# Patient Record
Sex: Male | Born: 1960 | Race: White | Hispanic: No | Marital: Married | Smoking: Former smoker
Health system: Southern US, Community
[De-identification: ages and names within clinical notes are randomized; demographics above are authoritative.]

## PROBLEM LIST (undated history)

## (undated) DIAGNOSIS — R972 Elevated prostate specific antigen [PSA]: Secondary | ICD-10-CM

## (undated) DIAGNOSIS — N400 Enlarged prostate without lower urinary tract symptoms: Secondary | ICD-10-CM

## (undated) HISTORY — DX: Benign prostatic hyperplasia without lower urinary tract symptoms: N40.0

## (undated) HISTORY — DX: Elevated prostate specific antigen (PSA): R97.20

## (undated) HISTORY — PX: OTHER SURGICAL HISTORY: SHX169

---

## 2006-04-21 ENCOUNTER — Encounter: Admission: RE | Admit: 2006-04-21 | Discharge: 2006-04-21 | Payer: Self-pay | Admitting: Orthopaedic Surgery

## 2008-03-31 ENCOUNTER — Ambulatory Visit (HOSPITAL_COMMUNITY): Admission: RE | Admit: 2008-03-31 | Discharge: 2008-04-01 | Payer: Self-pay | Admitting: Orthopaedic Surgery

## 2010-12-28 NOTE — Op Note (Signed)
Daniel Estrada, Daniel Estrada                ACCOUNT NO.:  [id number]   MEDICAL RECORD NO.:  [id number]          PATIENT TYPE:  OIB   LOCATION:  5013                         FACILITY:  [facility]   PHYSICIAN:  Mark C. Ophelia Charter, M.D.    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   PROCEDURE:  Right L4 laminotomy.  Microdiskectomy, removal of large  extruded fragment.   SURGEON:  Mark C. Ophelia Charter, MD   ANESTHESIA:  GOT.   ASSISTANT:  Wende Neighbors, PA-C   ESTIMATED BLOOD LOSS:  Minimal.   PROCEDURE:  After induction of general anesthesia and orotracheal  intubation, the patient placed in the [place] frame.  Standard prepping  and draping with DuraPrep, towels were used to square the area, Betadine  Vi-drape after sterile skin marker was used on the old incision, which  was at L5-S1 on the left.  Needle localization above this cross-table  lateral x-ray after laminectomy sheet confirmed that the needle was just  below the L4-L5 disk space.  Incision was made appropriately,  subperiosteal dissection, Taylor retractor placed laterally, ligament  was removed.  A Penfield #4 was placed just above the L4-L5 space.  Cross-table lateral x-ray confirmed this was appropriate position.  Operative microscope was draped and brought in, the ligament was taken  down, dura was exposed, and nerve root was gently retracted towards the  midline.  There was disk bulge with extruded subligamentous fragment and  portion of the fragment could be visualized, as it did pull the ligament  off the vertebral body at L5.  The annulus was incised slightly with  ligament with 15-blade and medially large extruded fragment was  grasped  with at the length of pituitary and gently teased out.  All came out one  large piece, passes were made through the disk space, and minimal amount  of remaining material.  Once the fragment had been removed, a hockey-  stick was used to palpate the pocket with the ligament that had been  pulled and L5 vertebral body was exposed with no remaining extruded  disk.  Nerve root was free, foramina was enlarged, palpation on the  nerve root was performed, and dura  was intact.  After irrigation of disk space, 0 Vicryl was placed on the  fascia, 2-0 on the subcutaneous tissue, 4-0 Vicryl subcuticular closure.  Tincture of benzoin and Steri-Strips, 4 x 4's tape, and then transferred  to supine position, extubated, and transferred to recovery room.  The  patient tolerated the procedure well.      Mark C. Ophelia Charter, M.D.  Electronically Signed     MCY/MEDQ  D:  03/31/2008  T:  04/01/2008  Job:  [id number]

## 2011-05-13 LAB — URINALYSIS, ROUTINE W REFLEX MICROSCOPIC
Glucose, UA: NEGATIVE
Hgb urine dipstick: NEGATIVE
pH: 5

## 2011-05-13 LAB — CBC
Hemoglobin: 17.2 — ABNORMAL HIGH
MCHC: 34.4
Platelets: 251
RDW: 12.8

## 2011-05-13 LAB — COMPREHENSIVE METABOLIC PANEL
AST: 25
Albumin: 4.7
BUN: 12
Chloride: 104
Creatinine, Ser: 1.08
Potassium: 4.5
Total Bilirubin: 1.1
Total Protein: 7

## 2015-10-02 ENCOUNTER — Encounter: Payer: Self-pay | Admitting: Family Medicine

## 2015-10-06 ENCOUNTER — Encounter: Payer: Self-pay | Admitting: Urology

## 2015-10-06 ENCOUNTER — Ambulatory Visit (INDEPENDENT_AMBULATORY_CARE_PROVIDER_SITE_OTHER): Payer: BLUE CROSS/BLUE SHIELD | Admitting: Urology

## 2015-10-06 VITALS — BP 120/74 | HR 60 | Ht 69.0 in | Wt 206.6 lb

## 2015-10-06 DIAGNOSIS — N401 Enlarged prostate with lower urinary tract symptoms: Secondary | ICD-10-CM | POA: Diagnosis not present

## 2015-10-06 DIAGNOSIS — R972 Elevated prostate specific antigen [PSA]: Secondary | ICD-10-CM | POA: Diagnosis not present

## 2015-10-06 DIAGNOSIS — N138 Other obstructive and reflux uropathy: Secondary | ICD-10-CM

## 2015-10-06 MED ORDER — FINASTERIDE 5 MG PO TABS: 5.0000 mg | ORAL_TABLET | Freq: Every day | ORAL | Status: DC

## 2015-10-06 NOTE — Progress Notes (Signed)
10/06/2015 4:09 PM   Daniel Estrada April 21, 1961 [id number]  Referring provider: No referring provider defined for this encounter.  Chief Complaint  Patient presents with  . Elevated PSA    referred by [facility]    HPI: Patient is a 55 year old Caucasian male who is referred by his primary care physician, Dr. Juanetta Gosling, for an elevated PSA.  He states he had not seen a doctor in several years. The elevated PSA was found on a routine well exam. Patient states he has frequent urination, nocturia and a weak urinary stream. His I PSS score is 14/2. He is not aware of any family history of prostate cancer.  He does not have any previous PSA's.  PSA is found to be 4.51 ng/mL on 09/28/2015.      IPSS      10/06/15 1500       International Prostate Symptom Score   How often have you had the sensation of not emptying your bladder? About half the time     How often have you had to urinate less than every two hours? About half the time     How often have you found you stopped and started again several times when you urinated? Less than 1 in 5 times     How often have you found it difficult to postpone urination? Less than 1 in 5 times     How often have you had a weak urinary stream? About half the time     How often have you had to strain to start urination? Less than 1 in 5 times     How many times did you typically get up at night to urinate? 2 Times     Total IPSS Score 14     Quality of Life due to urinary symptoms   If you were to spend the rest of your life with your urinary condition just the way it is now how would you feel about that? Mostly Satisfied        Score:  1-7 Mild 8-19 Moderate 20-35 Severe  He has mild to no erectile dysfunction. His SHIM score is 22. He denies any pain with erections or curvature with erections.      SHIM      10/06/15 1550       SHIM: Over the last 6 months:   How do you rate your confidence that you could get and keep an  erection? Moderate     When you had erections with sexual stimulation, how often were your erections hard enough for penetration (entering your partner)? Most Times (much more than half the time)     During sexual intercourse, how often were you able to maintain your erection after you had penetrated (entered) your partner? Not Difficult     During sexual intercourse, how difficult was it to maintain your erection to completion of intercourse? Not Difficult     When you attempted sexual intercourse, how often was it satisfactory for you? Not Difficult     SHIM Total Score   SHIM 22        Score: 1-7 Severe ED 8-11 Moderate ED 12-16 Mild-Moderate ED 17-21 Mild ED 22-25 No ED  He has not had any recent dysuria, gross hematuria or suprapubic pain. He has not had any recent fevers, chills, nausea or vomiting.  He cannot recall if he had sexual activity around the time his blood was drawn. He does not suffer with constipation  or hard stools.  PMH: Past Medical History  Diagnosis Date  . Elevated PSA     Surgical History: Past Surgical History  Procedure Laterality Date  . Back surgeries  2000/2010    ruptured discs    Home Medications:    Medication List       This list is accurate as of: 10/06/15  4:09 PM.  Always use your most recent med list.               finasteride 5 MG tablet  Commonly known as:  PROSCAR  Take 1 tablet (5 mg total) by mouth daily.        Allergies: No Known Allergies  Family History: Family History  Problem Relation Age of Onset  . Kidney disease Neg Hx   . Prostate cancer Neg Hx     Social History:  reports that he has quit smoking. His smoking use included Cigarettes. He smoked 0.50 packs per day. He does not have any smokeless tobacco history on file. He reports that he drinks alcohol. He reports that he does not use illicit drugs.  ROS: UROLOGY Frequent Urination?: Yes Hard to postpone urination?: No Burning/pain with urination?:  No Get up at night to urinate?: Yes Leakage of urine?: No Urine stream starts and stops?: No Trouble starting stream?: No Do you have to strain to urinate?: No Blood in urine?: No Urinary tract infection?: No Sexually transmitted disease?: No Injury to kidneys or bladder?: No Painful intercourse?: No Weak stream?: Yes Erection problems?: No Penile pain?: No  Gastrointestinal Nausea?: No Vomiting?: No Indigestion/heartburn?: No Diarrhea?: No Constipation?: No  Constitutional Fever: No Night sweats?: No Weight loss?: No Fatigue?: No  Skin Skin rash/lesions?: No Itching?: No  Eyes Blurred vision?: No Double vision?: No  Ears/Nose/Throat Sore throat?: No Sinus problems?: No  Hematologic/Lymphatic Swollen glands?: No Easy bruising?: No  Cardiovascular Leg swelling?: No Chest pain?: No  Respiratory Cough?: No Shortness of breath?: No  Endocrine Excessive thirst?: No  Musculoskeletal Back pain?: No Joint pain?: No  Neurological Headaches?: No Dizziness?: No  Psychologic Depression?: No Anxiety?: No  Physical Exam: BP 120/74 mmHg  Pulse 60  Ht 5\' 9"  (1.753 m)  Wt 206 lb 9.6 oz (93.713 kg)  BMI 30.50 kg/m2  Constitutional: Well nourished. Alert and oriented, No acute distress. HEENT: [place] AT, moist mucus membranes. Trachea midline, no masses. Cardiovascular: No clubbing, cyanosis, or edema. Respiratory: Normal respiratory effort, no increased work of breathing. GI: Abdomen is soft, non tender, non distended, no abdominal masses. Liver and spleen not palpable.  No hernias appreciated.  Stool sample for occult testing is not indicated.   GU: No CVA tenderness.  No bladder fullness or masses.  Patient with circumcised phallus.  Urethral meatus is patent.  No penile discharge. No penile lesions or rashes. Scrotum without lesions, cysts, rashes and/or edema.  Testicles are located scrotally bilaterally. No masses are appreciated in the testicles. Left  and right epididymis are normal. Rectal: Patient with  normal sphincter tone. Anus and perineum without scarring or rashes. No rectal masses are appreciated. Prostate is approximately 55 grams, no nodules are appreciated. Seminal vesicles are normal. Skin: No rashes, bruises or suspicious lesions. Lymph: No cervical or inguinal adenopathy. Neurologic: Grossly intact, no focal deficits, moving all 4 extremities. Psychiatric: Normal mood and affect.  Laboratory Data: Lab Results  Component Value Date   WBC 8.6 03/28/2008   HGB 17.2* 03/28/2008   HCT 49.9 03/28/2008   MCV 92.1 03/28/2008  PLT 251 03/28/2008   Lab Results  Component Value Date   CREATININE 1.08 03/28/2008   Lab Results  Component Value Date   AST 25 03/28/2008   Lab Results  Component Value Date   ALT 41 03/28/2008    Assessment & Plan:    1. Elevated PSA:   Patient was found to have an elevated PSA of 4.51.  I do not have previous PSA's to establish a trend.  I repeated the PSA today to make sure this is a true value and not a laboratory error.  I stated that the PSA did return elevated, we should pursue a biopsy at that time.  If it returned within normal parameters, I have started him on finasteride 5 mg daily and he'll be returning in 3 months. We will repeat a PSA at that time to establish a trend.  - PSA  2. BPH with LUTS:   Patient's IPSS score is 14/2.   I discussed the treatment options for BPH, such as: observation over time with prn avoidance of alcohol/caffeine; medical treatment or TURP.  Patient like to pursue medical treatment.  I will start finasteride 5 mg daily.    The side effects of finasteride are also discussed with the patient, such as: impotence, loss of interest in sex, or trouble having an orgasm; abnormal ejaculation; swelling in his hands and/or feet; swelling and/or tenderness in his breasts.  He will follow up in 3 months for a PSA, exam and an IPSS score.     Return in about 3 months  (around 01/03/2016) for IPSS score and PSA.  These notes generated with voice recognition software. I apologize for typographical errors.  Michiel Cowboy, PA-C  [facility] [address], [address] [place], [place] [address] [phone]

## 2015-10-07 ENCOUNTER — Telehealth: Payer: Self-pay

## 2015-10-07 DIAGNOSIS — N401 Enlarged prostate with lower urinary tract symptoms: Secondary | ICD-10-CM

## 2015-10-07 DIAGNOSIS — R972 Elevated prostate specific antigen [PSA]: Secondary | ICD-10-CM | POA: Insufficient documentation

## 2015-10-07 DIAGNOSIS — N138 Other obstructive and reflux uropathy: Secondary | ICD-10-CM | POA: Insufficient documentation

## 2015-10-07 LAB — PSA: PROSTATE SPECIFIC AG, SERUM: 3.5 ng/mL (ref 0.0–4.0)

## 2015-10-07 NOTE — Telephone Encounter (Signed)
Spoke with pt in reference to PSA results. Pt voiced understanding. Pt has an appt in May.

## 2015-10-07 NOTE — Telephone Encounter (Signed)
-----   Message from Harle Battiest, PA-C sent at 10/07/2015 12:12 PM EST ----- Patient's PSA has declined from 4.51 to 3.5.  I would like to see him again in 3 months.

## 2015-12-16 ENCOUNTER — Other Ambulatory Visit: Payer: Self-pay

## 2015-12-16 DIAGNOSIS — N400 Enlarged prostate without lower urinary tract symptoms: Secondary | ICD-10-CM

## 2015-12-16 MED ORDER — FINASTERIDE 5 MG PO TABS: 5.0000 mg | ORAL_TABLET | Freq: Every day | ORAL | Status: DC

## 2016-01-04 ENCOUNTER — Ambulatory Visit (INDEPENDENT_AMBULATORY_CARE_PROVIDER_SITE_OTHER): Payer: BLUE CROSS/BLUE SHIELD | Admitting: Urology

## 2016-01-04 ENCOUNTER — Encounter: Payer: Self-pay | Admitting: Urology

## 2016-01-04 VITALS — BP 118/73 | HR 60 | Ht 69.0 in | Wt 206.5 lb

## 2016-01-04 DIAGNOSIS — N401 Enlarged prostate with lower urinary tract symptoms: Secondary | ICD-10-CM

## 2016-01-04 DIAGNOSIS — R972 Elevated prostate specific antigen [PSA]: Secondary | ICD-10-CM | POA: Diagnosis not present

## 2016-01-04 DIAGNOSIS — N138 Other obstructive and reflux uropathy: Secondary | ICD-10-CM

## 2016-01-04 NOTE — Progress Notes (Signed)
4:23 PM   Daniel Estrada 10-11-1960 [id number]  Referring provider: No referring provider defined for this encounter.  Chief Complaint  Patient presents with  . Benign Prostatic Hypertrophy    3 month follow up  . Elevated PSA    HPI: Patient is a 55 year old Caucasian male who was found to have an elevated PSA by Dr. Juanetta Gosling with BPH and LUTS and was started on finasteride 5 mg daily who presents today for 3 month follow-up.  Elevated PSA Patient was found to have an elevated PSA of 4.51 ng/mL on 09/21/2015 during a well visit exam with Dr. Juanetta Gosling.  A repeated PSA at his initial visit with [place] was 3.5 on 10/06/2015.  He is not aware of any family history of prostate cancer.   He was initiated on finasteride 5 mg daily and asked to follow up in 3 months.  He presents today for a follow up PSA and exam.     BPH WITH LUTS His IPSS score today is 10, which is moderate lower urinary tract symptomatology. He is mostly satisfied with his quality life due to his urinary symptoms.  His previous IPSS score was 14/2.  His major complaint today frequency, urgency and nocturia 3.  He has had these symptoms for many years.  He denies any dysuria, hematuria or suprapubic pain.  He currently taking finasteride 5 mg daily.  He also denies any recent fevers, chills, nausea or vomiting. He does not have a family history of PCa.      IPSS      01/04/16 1500       International Prostate Symptom Score   How often have you had the sensation of not emptying your bladder? Less than 1 in 5     How often have you had to urinate less than every two hours? Less than half the time     How often have you found you stopped and started again several times when you urinated? Less than 1 in 5 times     How often have you found it difficult to postpone urination? Less than half the time     How often have you had a weak urinary stream? Less than 1 in 5 times     How often have you had to strain to start  urination? Not at All     How many times did you typically get up at night to urinate? 3 Times     Total IPSS Score 10     Quality of Life due to urinary symptoms   If you were to spend the rest of your life with your urinary condition just the way it is now how would you feel about that? Mostly Satisfied        Score:  1-7 Mild 8-19 Moderate 20-35 Severe  PMH: Past Medical History  Diagnosis Date  . Elevated PSA   . BPH (benign prostatic hyperplasia)     Surgical History: Past Surgical History  Procedure Laterality Date  . Back surgeries  2000/2010    ruptured discs    Home Medications:    Medication List       This list is accurate as of: 01/04/16  4:23 PM.  Always use your most recent med list.               finasteride 5 MG tablet  Commonly known as:  PROSCAR  Take 1 tablet (5 mg total) by mouth daily.  polyethylene glycol powder powder  Commonly known as:  GLYCOLAX/MIRALAX  See admin instructions. Reported on 01/04/2016        Allergies: No Known Allergies  Family History: Family History  Problem Relation Age of Onset  . Kidney disease Neg Hx   . Prostate cancer Neg Hx     Social History:  reports that he has quit smoking. His smoking use included Cigarettes. He smoked 0.50 packs per day. He does not have any smokeless tobacco history on file. He reports that he drinks alcohol. He reports that he does not use illicit drugs.  ROS: UROLOGY Frequent Urination?: Yes Hard to postpone urination?: Yes Burning/pain with urination?: No Get up at night to urinate?: Yes Leakage of urine?: No Urine stream starts and stops?: No Trouble starting stream?: No Do you have to strain to urinate?: No Blood in urine?: No Urinary tract infection?: No Sexually transmitted disease?: No Injury to kidneys or bladder?: No Painful intercourse?: No Weak stream?: No Erection problems?: No Penile pain?: No  Gastrointestinal Nausea?: No Vomiting?:  No Indigestion/heartburn?: No Diarrhea?: No Constipation?: No  Constitutional Fever: No Night sweats?: No Weight loss?: No Fatigue?: No  Skin Skin rash/lesions?: No Itching?: No  Eyes Blurred vision?: No Double vision?: No  Ears/Nose/Throat Sore throat?: No Sinus problems?: No  Hematologic/Lymphatic Swollen glands?: No Easy bruising?: No  Cardiovascular Leg swelling?: No Chest pain?: No  Respiratory Cough?: No Shortness of breath?: No  Endocrine Excessive thirst?: No  Musculoskeletal Back pain?: No Joint pain?: No  Neurological Headaches?: No Dizziness?: No  Psychologic Depression?: No Anxiety?: No  Physical Exam: BP 118/73 mmHg  Pulse 60  Ht 5\' 9"  (1.753 m)  Wt 206 lb 8 oz (93.668 kg)  BMI 30.48 kg/m2  Constitutional: Well nourished. Alert and oriented, No acute distress. HEENT: [place] AT, moist mucus membranes. Trachea midline, no masses. Cardiovascular: No clubbing, cyanosis, or edema. Respiratory: Normal respiratory effort, no increased work of breathing. GI: Abdomen is soft, non tender, non distended, no abdominal masses. Liver and spleen not palpable.  No hernias appreciated.  Stool sample for occult testing is not indicated.   GU: No CVA tenderness.  No bladder fullness or masses.  Patient with circumcised phallus.  Urethral meatus is patent.  No penile discharge. No penile lesions or rashes. Scrotum without lesions, cysts, rashes and/or edema.  Testicles are located scrotally bilaterally. No masses are appreciated in the testicles. Left and right epididymis are normal. Rectal: Patient with  normal sphincter tone. Anus and perineum without scarring or rashes. No rectal masses are appreciated. Prostate is approximately 55 grams, no nodules are appreciated. Seminal vesicles are normal. Skin: No rashes, bruises or suspicious lesions. Lymph: No cervical or inguinal adenopathy. Neurologic: Grossly intact, no focal deficits, moving all 4  extremities. Psychiatric: Normal mood and affect.  Laboratory Data: Lab Results  Component Value Date   WBC 8.6 03/28/2008   HGB 17.2* 03/28/2008   HCT 49.9 03/28/2008   MCV 92.1 03/28/2008   PLT 251 03/28/2008   Lab Results  Component Value Date   CREATININE 1.08 03/28/2008   Lab Results  Component Value Date   AST 25 03/28/2008   Lab Results  Component Value Date   ALT 41 03/28/2008   PSA history  4.51 ng/mL on 09/21/2015  3.50 ng/mL on 10/06/2015  1.80 ng/mL on 01/04/2016     Assessment & Plan:    1. Elevated PSA:   Patient was found to have an elevated PSA of 4.51.  Most  recent PSA at the of the visit was 3.5.  PSA is repeated today.  He will continue the finasteride 5 mg daily.  RTC in 6 months for PSA and exam.    2. BPH with LUTS:   Patient's IPSS score is 10/2.   Continue finasteride 5 mg daily.  RTC in 6 months for PSA, exam and IPSS score.    Return in about 6 months (around 07/06/2016) for IPSS, PSA and exam.  These notes generated with voice recognition software. I apologize for typographical errors.  Michiel Cowboy, PA-C  [facility] [address], [address] [place], [place] [address] [phone]

## 2016-01-05 ENCOUNTER — Telehealth: Payer: Self-pay

## 2016-01-05 LAB — PSA: Prostate Specific Ag, Serum: 1.8 ng/mL (ref 0.0–4.0)

## 2016-01-05 NOTE — Telephone Encounter (Signed)
-----   Message from Harle BattiestShannon A McGowan, PA-C sent at 01/05/2016 10:08 AM EDT ----- PSA has decreased.  We will see him in November.

## 2016-01-05 NOTE — Telephone Encounter (Signed)
Spoke with pt in reference to PSA results. Pt voiced understanding.  

## 2016-07-06 ENCOUNTER — Other Ambulatory Visit: Payer: BLUE CROSS/BLUE SHIELD

## 2016-07-13 ENCOUNTER — Ambulatory Visit: Payer: BLUE CROSS/BLUE SHIELD | Admitting: Urology

## 2016-09-12 ENCOUNTER — Other Ambulatory Visit: Payer: BLUE CROSS/BLUE SHIELD

## 2016-09-16 ENCOUNTER — Other Ambulatory Visit: Payer: Self-pay

## 2016-09-16 DIAGNOSIS — R972 Elevated prostate specific antigen [PSA]: Secondary | ICD-10-CM

## 2016-09-19 ENCOUNTER — Other Ambulatory Visit: Payer: BLUE CROSS/BLUE SHIELD

## 2016-09-19 ENCOUNTER — Ambulatory Visit: Payer: BLUE CROSS/BLUE SHIELD | Admitting: Urology

## 2016-09-19 DIAGNOSIS — R972 Elevated prostate specific antigen [PSA]: Secondary | ICD-10-CM

## 2016-09-20 LAB — PSA: Prostate Specific Ag, Serum: 1.2 ng/mL (ref 0.0–4.0)

## 2016-09-22 ENCOUNTER — Ambulatory Visit: Payer: BLUE CROSS/BLUE SHIELD | Admitting: Urology

## 2016-10-10 NOTE — Progress Notes (Signed)
This encounter was created in error - please disregard.

## 2016-10-11 ENCOUNTER — Encounter: Payer: Self-pay | Admitting: Urology

## 2016-10-11 ENCOUNTER — Encounter: Payer: BLUE CROSS/BLUE SHIELD | Admitting: Urology

## 2016-10-17 ENCOUNTER — Encounter: Payer: Self-pay | Admitting: Urology

## 2016-10-17 ENCOUNTER — Ambulatory Visit (INDEPENDENT_AMBULATORY_CARE_PROVIDER_SITE_OTHER): Payer: BLUE CROSS/BLUE SHIELD | Admitting: Urology

## 2016-10-17 VITALS — BP 107/61 | HR 60 | Ht 69.0 in | Wt 205.6 lb

## 2016-10-17 DIAGNOSIS — N401 Enlarged prostate with lower urinary tract symptoms: Secondary | ICD-10-CM | POA: Diagnosis not present

## 2016-10-17 DIAGNOSIS — Z87898 Personal history of other specified conditions: Secondary | ICD-10-CM

## 2016-10-17 DIAGNOSIS — N138 Other obstructive and reflux uropathy: Secondary | ICD-10-CM

## 2016-10-17 NOTE — Progress Notes (Signed)
4:52 PM   Daniel Estrada 1961-01-22 [id number]  Referring provider: No referring provider defined for this encounter.  Chief Complaint  Patient presents with  . Benign Prostatic Hypertrophy    last office visit 05 /2017  . Elevated PSA    HPI: 56 yo WM with a history of an elevated PSA with BPH and LU TS who presents today for a 6 month follow up.   History of elevated PSA Patient was found to have an elevated PSA of 4.51 ng/mL on 09/21/2015 during a well visit exam with Dr. Juanetta Gosling.  A repeated PSA at his initial visit with [place] was 3.5 on 10/06/2015.  He is not aware of any family history of prostate cancer.   Started on finasteride 5 mg daily in 09/2015.  Current PSA is 1.2 ng/mL on 09/19/2016.  BPH WITH LUTS His IPSS score today is 10, which is moderate lower urinary tract symptomatology.  He is mostly satisfied with his quality life due to his urinary symptoms.  His previous IPSS score was 10/2.  His major complaints today are frequency, nocturia and weak stream.  He has had these symptoms for the last few years.  He denies any dysuria, hematuria or suprapubic pain.  He currently taking finasteride 5 mg daily.  He also denies any recent fevers, chills, nausea or vomiting.  He does not have a family history of PCa.     IPSS    Row Name 10/11/16 0800 10/17/16 1600       International Prostate Symptom Score   How often have you had the sensation of not emptying your bladder? Less than 1 in 5 Less than 1 in 5    How often have you had to urinate less than every two hours? Less than 1 in 5 times Less than half the time    How often have you found you stopped and started again several times when you urinated? Not at All Less than 1 in 5 times    How often have you found it difficult to postpone urination? Less than 1 in 5 times Less than 1 in 5 times    How often have you had a weak urinary stream? Less than half the time About half the time    How often have you had to strain to  start urination? Not at All Not at All    How many times did you typically get up at night to urinate? 2 Times 2 Times    Total IPSS Score 7 10      Quality of Life due to urinary symptoms   If you were to spend the rest of your life with your urinary condition just the way it is now how would you feel about that? Pleased Mostly Satisfied       Score:  1-7 Mild 8-19 Moderate 20-35 Severe  PMH: Past Medical History:  Diagnosis Date  . BPH (benign prostatic hyperplasia)   . Elevated PSA     Surgical History: Past Surgical History:  Procedure Laterality Date  . Back surgeries  2000/2010   ruptured discs    Home Medications:  Allergies as of 10/17/2016   No Known Allergies     Medication List       Accurate as of 10/17/16  4:52 PM. Always use your most recent med list.          finasteride 5 MG tablet Commonly known as:  PROSCAR Take 1 tablet (5 mg  total) by mouth daily.   polyethylene glycol powder powder Commonly known as:  GLYCOLAX/MIRALAX See admin instructions. Reported on 01/04/2016       Allergies: No Known Allergies  Family History: Family History  Problem Relation Age of Onset  . Kidney disease Neg Hx   . Prostate cancer Neg Hx   . Bladder Cancer Neg Hx   . Kidney cancer Neg Hx     Social History:  reports that he has quit smoking. His smoking use included Cigarettes. He smoked 0.50 packs per day. He quit smokeless tobacco use about 23 months ago. His smokeless tobacco use included Snuff. He reports that he drinks alcohol. He reports that he does not use drugs.  ROS: UROLOGY Frequent Urination?: Yes Hard to postpone urination?: No Burning/pain with urination?: No Get up at night to urinate?: Yes Leakage of urine?: No Urine stream starts and stops?: No Trouble starting stream?: No Do you have to strain to urinate?: No Blood in urine?: No Urinary tract infection?: No Sexually transmitted disease?: No Injury to kidneys or bladder?:  No Painful intercourse?: No Weak stream?: Yes Erection problems?: No Penile pain?: No  Gastrointestinal Nausea?: No Vomiting?: No Indigestion/heartburn?: No Diarrhea?: No Constipation?: No  Constitutional Fever: No Night sweats?: No Weight loss?: No Fatigue?: No  Skin Skin rash/lesions?: No Itching?: No  Eyes Blurred vision?: No Double vision?: No  Ears/Nose/Throat Sore throat?: No Sinus problems?: No  Hematologic/Lymphatic Swollen glands?: No Easy bruising?: No  Cardiovascular Leg swelling?: No Chest pain?: No  Respiratory Cough?: No Shortness of breath?: No  Endocrine Excessive thirst?: No  Musculoskeletal Back pain?: No Joint pain?: No  Neurological Headaches?: No Dizziness?: No  Psychologic Depression?: No Anxiety?: No  Physical Exam: BP 107/61   Pulse 60   Ht 5\' 9"  (1.753 m)   Wt 205 lb 9.6 oz (93.3 kg)   BMI 30.36 kg/m   Constitutional: Well nourished. Alert and oriented, No acute distress. HEENT: [place] AT, moist mucus membranes. Trachea midline, no masses. Cardiovascular: No clubbing, cyanosis, or edema. Respiratory: Normal respiratory effort, no increased work of breathing. GI: Abdomen is soft, non tender, non distended, no abdominal masses. Liver and spleen not palpable.  No hernias appreciated.  Stool sample for occult testing is not indicated.   GU: No CVA tenderness.  No bladder fullness or masses.  Patient with circumcised phallus.  Urethral meatus is patent.  No penile discharge. No penile lesions or rashes. Scrotum without lesions, cysts, rashes and/or edema.  Testicles are located scrotally bilaterally. No masses are appreciated in the testicles. Left and right epididymis are normal. Rectal: Patient with  normal sphincter tone. Anus and perineum without scarring or rashes. No rectal masses are appreciated. Prostate is approximately 55 grams, no nodules are appreciated. Seminal vesicles are normal. Skin: No rashes, bruises or  suspicious lesions. Lymph: No cervical or inguinal adenopathy. Neurologic: Grossly intact, no focal deficits, moving all 4 extremities. Psychiatric: Normal mood and affect.  Laboratory Data: Lab Results  Component Value Date   WBC 8.6 03/28/2008   HGB 17.2 (H) 03/28/2008   HCT 49.9 03/28/2008   MCV 92.1 03/28/2008   PLT 251 03/28/2008   Lab Results  Component Value Date   CREATININE 1.08 03/28/2008   Lab Results  Component Value Date   AST 25 03/28/2008   Lab Results  Component Value Date   ALT 41 03/28/2008   PSA history  4.51 ng/mL on 09/21/2015  3.50 ng/mL on 10/06/2015  1.80 ng/mL on 01/04/2016  1.20 ng/mL on 09/19/2016     Assessment & Plan:    1. History of elevated PSA  - found to have an elevated PSA of 4.51  - started finasteride 5 mg daily in 2017  - current PSA 1.2  - RTC in 12 months for PSA and exam     2. BPH with LU TS  - IPSS score is 10/2, it is stable  - Continue conservative management, avoiding bladder irritants and timed voiding's  - Continue finasteride 5 mg daily: refills given  - RTC in 12 months for IPSS, PSA and exam   Return in about 1 year (around 10/17/2017) for IPSS, PSA and exam.  These notes generated with voice recognition software. I apologize for typographical errors.  Michiel CowboySHANNON Orlean Holtrop, PA-C  [facility] [address], [address] [place], [place] [address] [phone]

## 2017-01-15 ENCOUNTER — Other Ambulatory Visit: Payer: Self-pay | Admitting: Urology

## 2017-10-13 ENCOUNTER — Other Ambulatory Visit: Payer: BLUE CROSS/BLUE SHIELD

## 2017-10-13 ENCOUNTER — Other Ambulatory Visit: Payer: Self-pay

## 2017-10-13 DIAGNOSIS — N401 Enlarged prostate with lower urinary tract symptoms: Secondary | ICD-10-CM

## 2017-10-14 LAB — PSA: Prostate Specific Ag, Serum: 1 ng/mL (ref 0.0–4.0)

## 2017-10-17 ENCOUNTER — Encounter: Payer: Self-pay | Admitting: Urology

## 2017-10-17 ENCOUNTER — Ambulatory Visit: Payer: BLUE CROSS/BLUE SHIELD | Admitting: Urology

## 2017-10-17 VITALS — BP 139/79 | HR 64 | Ht 69.0 in | Wt 200.0 lb

## 2017-10-17 DIAGNOSIS — Z87898 Personal history of other specified conditions: Secondary | ICD-10-CM | POA: Diagnosis not present

## 2017-10-17 DIAGNOSIS — N401 Enlarged prostate with lower urinary tract symptoms: Secondary | ICD-10-CM

## 2017-10-17 DIAGNOSIS — N138 Other obstructive and reflux uropathy: Secondary | ICD-10-CM | POA: Diagnosis not present

## 2017-10-17 NOTE — Progress Notes (Signed)
4:13 PM   Daniel Estrada Oct 09, 1960 [id number]  Referring provider: Dorothey Baseman, MD [phone] [place], [place] [address]  Chief Complaint  Patient presents with  . Benign Prostatic Hypertrophy  . Follow-up    HPI: 57 yo WM with a history of an elevated PSA with BPH and LU TS who presents today for a one year follow up.   History of elevated PSA Patient was found to have an elevated PSA of 4.51 ng/mL on 09/21/2015 during a well visit exam with Dr. Juanetta Gosling.  A repeated PSA at his initial visit with [place] was 3.5 on 10/06/2015.  He is not aware of any family history of prostate cancer.   Started on finasteride 5 mg daily in 09/2015.  Current PSA is 1.0 ng/mL on 10/13/2017.    BPH WITH LUTS His IPSS score today is 8, which is moderate lower urinary tract symptomatology.  He is mostly satisfied with his quality life due to his urinary symptoms.  His previous IPSS score was 10/2.  He denies any dysuria, hematuria or suprapubic pain.  He currently taking finasteride 5 mg daily.  He also denies any recent fevers, chills, nausea or vomiting.  He does not have a family history of PCa. IPSS    Row Name 10/17/17 1500         International Prostate Symptom Score   How often have you had the sensation of not emptying your bladder?  Less than half the time     How often have you had to urinate less than every two hours?  Less than 1 in 5 times     How often have you found you stopped and started again several times when you urinated?  Less than 1 in 5 times     How often have you found it difficult to postpone urination?  Less than 1 in 5 times     How often have you had a weak urinary stream?  Less than 1 in 5 times     How often have you had to strain to start urination?  Not at All     How many times did you typically get up at night to urinate?  2 Times     Total IPSS Score  8       Quality of Life due to urinary symptoms   If you were to spend the rest of your life with your  urinary condition just the way it is now how would you feel about that?  Mostly Satisfied        Score:  1-7 Mild 8-19 Moderate 20-35 Severe  PMH: Past Medical History:  Diagnosis Date  . BPH (benign prostatic hyperplasia)   . Elevated PSA     Surgical History: Past Surgical History:  Procedure Laterality Date  . Back surgeries  2000/2010   ruptured discs    Home Medications:  Allergies as of 10/17/2017   No Known Allergies     Medication List        Accurate as of 10/17/17  4:13 PM. Always use your most recent med list.          finasteride 5 MG tablet Commonly known as:  PROSCAR Take 1 tablet (5 mg total) by mouth daily.   finasteride 5 MG tablet Commonly known as:  PROSCAR TAKE 1 TABLET (5 MG TOTAL) BY MOUTH DAILY.   polyethylene glycol powder powder Commonly known as:  GLYCOLAX/MIRALAX See admin instructions. Reported on  01/04/2016       Allergies: No Known Allergies  Family History: Family History  Problem Relation Age of Onset  . Kidney disease Neg Hx   . Prostate cancer Neg Hx   . Bladder Cancer Neg Hx   . Kidney cancer Neg Hx     Social History:  reports that he has quit smoking. His smoking use included cigarettes. He smoked 0.50 packs per day. He quit smokeless tobacco use about 2 years ago. His smokeless tobacco use included snuff. He reports that he drinks alcohol. He reports that he does not use drugs.  ROS: UROLOGY Frequent Urination?: No Hard to postpone urination?: No Burning/pain with urination?: No Get up at night to urinate?: No Leakage of urine?: No Urine stream starts and stops?: No Trouble starting stream?: No Do you have to strain to urinate?: No Blood in urine?: No Urinary tract infection?: No Sexually transmitted disease?: No Injury to kidneys or bladder?: No Painful intercourse?: No Weak stream?: No Erection problems?: No Penile pain?: No  Gastrointestinal Nausea?: No Vomiting?: No Indigestion/heartburn?:  No Diarrhea?: No Constipation?: No  Constitutional Fever: No Night sweats?: No Weight loss?: No Fatigue?: No  Skin Skin rash/lesions?: No Itching?: No  Eyes Blurred vision?: No Double vision?: No  Ears/Nose/Throat Sore throat?: No Sinus problems?: No  Hematologic/Lymphatic Swollen glands?: No Easy bruising?: No  Cardiovascular Leg swelling?: No Chest pain?: No  Respiratory Cough?: No Shortness of breath?: No  Endocrine Excessive thirst?: No  Musculoskeletal Back pain?: No Joint pain?: No  Neurological Headaches?: No Dizziness?: No  Psychologic Depression?: No Anxiety?: No  Physical Exam: BP 139/79   Pulse 64   Ht 5\' 9"  (1.753 m)   Wt 200 lb (90.7 kg)   BMI 29.53 kg/m   Constitutional: Well nourished. Alert and oriented, No acute distress. HEENT: [place] AT, moist mucus membranes. Trachea midline, no masses. Cardiovascular: No clubbing, cyanosis, or edema. Respiratory: Normal respiratory effort, no increased work of breathing. GI: Abdomen is soft, non tender, non distended, no abdominal masses. Liver and spleen not palpable.  No hernias appreciated.  Stool sample for occult testing is not indicated.   GU: No CVA tenderness.  No bladder fullness or masses.  Patient with circumcised phallus.  Urethral meatus is patent.  No penile discharge. No penile lesions or rashes. Scrotum without lesions, cysts, rashes and/or edema.  Testicles are located scrotally bilaterally. No masses are appreciated in the testicles. Left and right epididymis are normal. Rectal: Patient with  normal sphincter tone. Anus and perineum without scarring or rashes. No rectal masses are appreciated. Prostate is approximately 55 grams, no nodules are appreciated. Seminal vesicles are normal. Skin: No rashes, bruises or suspicious lesions. Lymph: No cervical or inguinal adenopathy. Neurologic: Grossly intact, no focal deficits, moving all 4 extremities. Psychiatric: Normal mood and  affect.   Laboratory Data: Lab Results  Component Value Date   WBC 8.6 03/28/2008   HGB 17.2 (H) 03/28/2008   HCT 49.9 03/28/2008   MCV 92.1 03/28/2008   PLT 251 03/28/2008   Lab Results  Component Value Date   CREATININE 1.08 03/28/2008   Lab Results  Component Value Date   AST 25 03/28/2008   Lab Results  Component Value Date   ALT 41 03/28/2008   PSA history  4.51 ng/mL on 09/21/2015  3.50 ng/mL on 10/06/2015  1.80 ng/mL on 01/04/2016  1.20 ng/mL on 09/19/2016  1.0 ng/mL on 10/13/2017   Assessment & Plan:    1. History of elevated  PSA  - found to have an elevated PSA of 4.51  - started finasteride 5 mg daily in 2017  - current PSA 1.0  - RTC in 12 months for PSA and exam     2. BPH with LU TS  - IPSS score is 8/2, it is improved  - Continue conservative management, avoiding bladder irritants and timed voiding's  - Continue finasteride 5 mg daily: refills given  - RTC in 12 months for IPSS, PSA and exam   Return in about 6 months (around 04/19/2018) for IPSS, PSA and exam.  These notes generated with voice recognition software. I apologize for typographical errors.  Michiel Cowboy, PA-C  [facility] [address], [address] [place], [place] [address] [phone]

## 2017-10-25 ENCOUNTER — Other Ambulatory Visit: Payer: Self-pay

## 2017-10-25 DIAGNOSIS — N400 Enlarged prostate without lower urinary tract symptoms: Secondary | ICD-10-CM

## 2017-10-25 MED ORDER — FINASTERIDE 5 MG PO TABS: 5.0000 mg | ORAL_TABLET | Freq: Every day | ORAL | 3 refills | Status: DC

## 2017-10-26 ENCOUNTER — Telehealth: Payer: Self-pay | Admitting: Urology

## 2017-10-26 NOTE — Telephone Encounter (Signed)
Patient does not need to follow up in 6 months.  He can follow up in one year.

## 2017-10-30 NOTE — Telephone Encounter (Signed)
apps made and mailed to patient at his request  [facility]

## 2018-01-20 ENCOUNTER — Other Ambulatory Visit: Payer: Self-pay | Admitting: Urology

## 2018-04-24 ENCOUNTER — Other Ambulatory Visit: Payer: BLUE CROSS/BLUE SHIELD

## 2018-04-26 ENCOUNTER — Ambulatory Visit: Payer: BLUE CROSS/BLUE SHIELD | Admitting: Urology

## 2018-10-22 ENCOUNTER — Other Ambulatory Visit: Payer: Self-pay | Admitting: Urology

## 2018-10-22 DIAGNOSIS — Z87898 Personal history of other specified conditions: Secondary | ICD-10-CM

## 2018-10-23 ENCOUNTER — Other Ambulatory Visit: Payer: BLUE CROSS/BLUE SHIELD

## 2018-10-23 DIAGNOSIS — Z87898 Personal history of other specified conditions: Secondary | ICD-10-CM

## 2018-10-24 LAB — PSA: Prostate Specific Ag, Serum: 1.2 ng/mL (ref 0.0–4.0)

## 2018-10-26 ENCOUNTER — Ambulatory Visit: Payer: BLUE CROSS/BLUE SHIELD | Admitting: Urology

## 2018-11-06 ENCOUNTER — Ambulatory Visit: Payer: BLUE CROSS/BLUE SHIELD | Admitting: Urology

## 2019-01-09 ENCOUNTER — Emergency Department
Admission: EM | Admit: 2019-01-09 | Discharge: 2019-01-09 | Disposition: A | Discharge status: Home / Self Care | Payer: BLUE CROSS/BLUE SHIELD | Attending: Emergency Medicine | Admitting: Emergency Medicine

## 2019-01-09 ENCOUNTER — Other Ambulatory Visit: Payer: Self-pay

## 2019-01-09 ENCOUNTER — Encounter: Payer: Self-pay | Admitting: Emergency Medicine

## 2019-01-09 ENCOUNTER — Emergency Department: Payer: BLUE CROSS/BLUE SHIELD

## 2019-01-09 DIAGNOSIS — R4182 Altered mental status, unspecified: Secondary | ICD-10-CM | POA: Diagnosis present

## 2019-01-09 DIAGNOSIS — Z87891 Personal history of nicotine dependence: Secondary | ICD-10-CM | POA: Insufficient documentation

## 2019-01-09 DIAGNOSIS — Z79899 Other long term (current) drug therapy: Secondary | ICD-10-CM | POA: Diagnosis not present

## 2019-01-09 DIAGNOSIS — G454 Transient global amnesia: Secondary | ICD-10-CM | POA: Diagnosis not present

## 2019-01-09 LAB — APTT: aPTT: 26 seconds (ref 24–36)

## 2019-01-09 LAB — COMPREHENSIVE METABOLIC PANEL
ALT: 27 U/L (ref 0–44)
AST: 27 U/L (ref 15–41)
Albumin: 5 g/dL (ref 3.5–5.0)
Alkaline Phosphatase: 53 U/L (ref 38–126)
Anion gap: 9 (ref 5–15)
BUN: 22 mg/dL — ABNORMAL HIGH (ref 6–20)
CO2: 26 mmol/L (ref 22–32)
Calcium: 9.6 mg/dL (ref 8.9–10.3)
Chloride: 105 mmol/L (ref 98–111)
Creatinine, Ser: 1.25 mg/dL — ABNORMAL HIGH (ref 0.61–1.24)
GFR calc Af Amer: >60 mL/min (ref >60)
GFR calc non Af Amer: >60 mL/min (ref >60)
Glucose, Bld: 100 mg/dL — ABNORMAL HIGH (ref 70–99)
Potassium: 4.5 mmol/L (ref 3.5–5.1)
Sodium: 140 mmol/L (ref 135–145)
Total Bilirubin: 0.7 mg/dL (ref 0.3–1.2)
Total Protein: 7.6 g/dL (ref 6.5–8.1)

## 2019-01-09 LAB — CBC WITH DIFFERENTIAL/PLATELET
Abs Immature Granulocytes: 0.04 10*3/uL (ref 0.00–0.07)
Basophils Absolute: 0.1 10*3/uL (ref 0.0–0.1)
Basophils Relative: 1 %
Eosinophils Absolute: 0.1 10*3/uL (ref 0.0–0.5)
Eosinophils Relative: 1 %
HCT: 50.2 % (ref 39.0–52.0)
Hemoglobin: 17.2 g/dL — ABNORMAL HIGH (ref 13.0–17.0)
Immature Granulocytes: 0 %
Lymphocytes Relative: 15 %
Lymphs Abs: 1.8 10*3/uL (ref 0.7–4.0)
MCH: 30.5 pg (ref 26.0–34.0)
MCHC: 34.3 g/dL (ref 30.0–36.0)
MCV: 89 fL (ref 80.0–100.0)
Monocytes Absolute: 0.6 10*3/uL (ref 0.1–1.0)
Monocytes Relative: 5 %
Neutro Abs: 9.7 10*3/uL — ABNORMAL HIGH (ref 1.7–7.7)
Neutrophils Relative %: 78 %
Platelets: 261 10*3/uL (ref 150–400)
RBC: 5.64 MIL/uL (ref 4.22–5.81)
RDW: 12.3 % (ref 11.5–15.5)
WBC: 12.3 10*3/uL — ABNORMAL HIGH (ref 4.0–10.5)
nRBC: 0 % (ref 0.0–0.2)

## 2019-01-09 LAB — URINALYSIS, ROUTINE W REFLEX MICROSCOPIC
Bilirubin Urine: NEGATIVE
Glucose, UA: NEGATIVE mg/dL
Hgb urine dipstick: NEGATIVE
Ketones, ur: NEGATIVE mg/dL
Leukocytes,Ua: NEGATIVE
Nitrite: NEGATIVE
Protein, ur: NEGATIVE mg/dL
Specific Gravity, Urine: 1.01 (ref 1.005–1.030)
pH: 5 (ref 5.0–8.0)

## 2019-01-09 LAB — CBC
HCT: 50.1 % (ref 39.0–52.0)
Hemoglobin: 17.1 g/dL — ABNORMAL HIGH (ref 13.0–17.0)
MCH: 30.3 pg (ref 26.0–34.0)
MCHC: 34.1 g/dL (ref 30.0–36.0)
MCV: 88.7 fL (ref 80.0–100.0)
Platelets: 254 10*3/uL (ref 150–400)
RBC: 5.65 MIL/uL (ref 4.22–5.81)
RDW: 12.2 % (ref 11.5–15.5)
WBC: 12.3 10*3/uL — ABNORMAL HIGH (ref 4.0–10.5)
nRBC: 0 % (ref 0.0–0.2)

## 2019-01-09 LAB — URINE DRUG SCREEN, QUALITATIVE (ARMC ONLY)
Amphetamines, Ur Screen: NOT DETECTED
Barbiturates, Ur Screen: NOT DETECTED
Benzodiazepine, Ur Scrn: NOT DETECTED
Cannabinoid 50 Ng, Ur ~~LOC~~: NOT DETECTED
Cocaine Metabolite,Ur ~~LOC~~: NOT DETECTED
MDMA (Ecstasy)Ur Screen: NOT DETECTED
Methadone Scn, Ur: NOT DETECTED
Opiate, Ur Screen: NOT DETECTED
Phencyclidine (PCP) Ur S: NOT DETECTED
Tricyclic, Ur Screen: NOT DETECTED

## 2019-01-09 LAB — PROTIME-INR
INR: 1 (ref 0.8–1.2)
Prothrombin Time: 12.9 seconds (ref 11.4–15.2)

## 2019-01-09 LAB — ETHANOL: Alcohol, Ethyl (B): <10 mg/dL (ref <10)

## 2019-01-09 NOTE — ED Notes (Addendum)
Called lab because orders had not been run that were ordered at 2047

## 2019-01-09 NOTE — ED Triage Notes (Addendum)
Pt in via POV, per wife, patient has had sudden onset short term memory loss since approximately 1715 this evening.  Pt reports working out this evening in his garage, having dinner, and a shower; pt with difficulty recalling details of his workout and what he had for dinner.  Pt denies any complaints, NAD noted at this time.

## 2019-01-09 NOTE — Progress Notes (Signed)
[facility]   Date of Service:   01/09/2019 21:06:41  Impression:     .  Rule Out Acute Ischemic Stroke     .  transient global amnesia  Comments/Sign-Out: Patient with no significant history other than BPH presents after episode of amnesia, short term memory loss. Now appears resolved. Most likely c/w Transient global amnesia but stroke cannot be excluded. Will need to start asa if no contraindications and MRI brain, stroke workup. d/w ER physician on the phone at 21:26. Per his examination, NIHSS 0 and therefore patient not a candidate for tpa. No evidence of LVO clinically, hence no need for emergent CTA imaging.  Mechanism of Stroke: Small Vessel Disease  Metrics: Last Known Well: 01/09/2019 21:32:13 [facility] Notification Time: 01/09/2019 21:06:41 Arrival Time: 01/09/2019 20:21:00 Stamp Time: 01/09/2019 21:06:41 Time First Login Attempt: 01/09/2019 21:13:02 Video Start Time: 01/09/2019 21:13:02  Symptoms: memory loss NIHSS Start Assessment Time: 01/09/2019 21:26:50 Patient is not a candidate for tPA. Patient was not deemed candidate for tPA thrombolytics because of Resolved symptoms (no residual disabling symptoms).  CT head showed no acute hemorrhage or acute core infarct. CT head was reviewed.  Clinical Presentation is not Suggestive of Large Vessel Occlusive Disease  ED Physician notified of diagnostic impression and management plan on 01/09/2019 21:27:52  Our recommendations are outlined below.  Recommendations:     .  Activate Stroke Protocol Admission/Order Set     .  Stroke/Telemetry Floor     .  Neuro Checks     .  Bedside Swallow Eval     .  DVT Prophylaxis     .  IV Fluids, Normal Saline     .  Head of Bed 30 Degrees     .  Euglycemia and Avoid Hyperthermia (PRN Acetaminophen)     .  Initiate Aspirin  Routine Consultation with Inhouse Neurology for Follow up Care  Sign Out:     .  Discussed  with Emergency Department Provider    ------------------------------------------------------------------------------  History of Present Illness: Patient is a 58 year old Male.  Patient was brought by private transportation with symptoms of memory loss  Patient had trouble with memory today. Around 5:15pm, he could not remember being at the gym or cleaning out the garage or having dinner. He could not remember what he had for dinner. He kept asking the same thing over a dozen times. Now he is back to normal. d/w ER physician since unable to connect via video. His exam is completely normal, he is AAOX4.  Last seen normal was within 4.5 hours. There is no history of hemorrhagic complications or intracranial hemorrhage. There is no history of Recent Anticoagulants. There is no history of recent major surgery. There is no history of recent stroke.  Examination: BP(141/93), Pulse(81), Blood Glucose(100) 1A: Level of Consciousness - Alert; keenly responsive + 0 1B: Ask Month and Age - Both Questions Right + 0 1C: Blink Eyes & Squeeze Hands - Performs Both Tasks + 0 2: Test Horizontal Extraocular Movements - Normal + 0 3: Test Visual Fields - No Visual Loss + 0 4: Test Facial Palsy (Use Grimace if Obtunded) - Normal symmetry + 0 5A: Test Left Arm Motor Drift - No Drift for 10 Seconds + 0 5B: Test Right Arm Motor Drift - No Drift for 10 Seconds + 0 6A: Test Left Leg Motor Drift - No Drift for 5 Seconds + 0 6B: Test Right Leg Motor Drift - No Drift  for 5 Seconds + 0 7: Test Limb Ataxia (FNF/Heel-Shin) - No Ataxia + 0 8: Test Sensation - Normal; No sensory loss + 0 9: Test Language/Aphasia - Normal; No aphasia + 0 10: Test Dysarthria - Normal + 0 11: Test Extinction/Inattention - No abnormality + 0  NIHSS Score: 0  Patient/Family was informed the Neurology Consult would happen via TeleHealth consult by way of interactive audio and video telecommunications and consented to receiving care  in this manner.  Due to the immediate potential for life-threatening deterioration due to underlying acute neurologic illness, I spent 35 minutes providing critical care. This time includes time for face to face visit via telemedicine, review of medical records, imaging studies and discussion of findings with providers, the patient and/or family.   Dr Natasha BenceYelena Saren Corkern   [facility] [phone]   Case [id number]

## 2019-01-09 NOTE — ED Notes (Signed)
MRI to complete screening

## 2019-01-09 NOTE — ED Triage Notes (Signed)
Patient ambulatory to triage with steady gait, without difficulty or distress noted; wife reports pt was working out in home gym, came back inside at approx 515pm and has no memory of events; pt with no c/o at present, denies hx of same but wife reports he is asking repetitive questions and has no memory of working out; pt to room 13 by Morrie Sheldon, Charity fundraiser for further triage and evaluation; report called to care nurse Lurena Joiner RN

## 2019-01-10 ENCOUNTER — Telehealth: Payer: Self-pay | Admitting: Urology

## 2019-01-10 ENCOUNTER — Ambulatory Visit: Payer: BLUE CROSS/BLUE SHIELD | Admitting: Urology

## 2019-01-10 ENCOUNTER — Telehealth (INDEPENDENT_AMBULATORY_CARE_PROVIDER_SITE_OTHER): Payer: BLUE CROSS/BLUE SHIELD | Admitting: Urology

## 2019-01-10 DIAGNOSIS — R799 Abnormal finding of blood chemistry, unspecified: Secondary | ICD-10-CM

## 2019-01-10 DIAGNOSIS — N400 Enlarged prostate without lower urinary tract symptoms: Secondary | ICD-10-CM

## 2019-01-10 DIAGNOSIS — Z87898 Personal history of other specified conditions: Secondary | ICD-10-CM | POA: Diagnosis not present

## 2019-01-10 DIAGNOSIS — N138 Other obstructive and reflux uropathy: Secondary | ICD-10-CM

## 2019-01-10 DIAGNOSIS — R7989 Other specified abnormal findings of blood chemistry: Secondary | ICD-10-CM

## 2019-01-10 MED ORDER — FINASTERIDE 5 MG PO TABS: 5.0000 mg | ORAL_TABLET | Freq: Every day | ORAL | 3 refills | Status: DC

## 2019-01-10 NOTE — Telephone Encounter (Signed)
Would you call Daniel Estrada and schedule him for an appointment in 6 months for I PSS, PSA and exam?  PSA to be drawn prior to appointment.

## 2019-01-10 NOTE — Progress Notes (Signed)
Virtual Visit via Audio/Visual Note  I connected with Daniel Estrada on 01/10/2019 at 0900 by audio/visual [email or web address]) and verified that I am speaking with the correct person using two identifiers.  They are located at work  I am located at my home.    This visit type was conducted due to national recommendations for restrictions regarding the COVID-19 Pandemic (e.g. social distancing).  This format is felt to be most appropriate for this patient at this time.  All issues noted in this document were discussed and addressed.  No physical exam was performed.   I discussed the limitations, risks, security and privacy concerns of performing an evaluation and management service by telephone and the availability of in person appointments. I also discussed with the patient that there may be a patient responsible charge related to this service. The patient expressed understanding and agreed to proceed.   History of Present Illness: Daniel Estrada is a 58 year old Caucasian male with a history of an elevated PSA and BPH with LU TS who is contacted today via [email or web address]  for his yearly visit due to the COVID-19 pandemic.    History of elevated PSA - most recent PSA was 1.2 (2.4) on 10/23/2018 - on finasteride 5 mg daily  BPH with LU TS - he has no bothersome complaints at this time, Patient denies any gross hematuria, dysuria or suprapubic/flank pain.  Patient denies any fevers, chills, nausea or vomiting. currently on finasteride 5 mg daily   Of note, he was seen in the ED yesterday for an episode of transient global amnesia which was due to dehydration.  BUN and creatinine were elevated at  22/1.25.     Observations/Objective: He is dressed appropriately.  He does not appear distressed.    Assessment and Plan:  1. History of elevated PSA Current PSA 1.2 (2.4) Continue finasteride 5 mg daily  2. BPH with LU TS No bothersome symptoms Continue finasteride 5 mg daily  3. Elevated BUN/creatinine Encouraged  patient to have levels rechecked with PCP or [place]   Follow Up Instructions:  Daniel Estrada will return to the clinic in 6 months for I PSS, PSA and exam.   I discussed the assessment and treatment plan with the patient. The patient was provided an opportunity to ask questions and all were answered. The patient agreed with the plan and demonstrated an understanding of the instructions.   The patient was advised to call back or seek an in-person evaluation if the symptoms worsen or if the condition fails to improve as anticipated.  I provided 8 minutes of  face-to-face time during this encounter.   Kathreen Dileo, PA-C

## 2019-01-15 NOTE — ED Provider Notes (Signed)
[facility] Emergency Department Provider Note   ____________________________________________    I have reviewed the triage vital signs and the nursing notes.   HISTORY  Chief Complaint Altered Mental Status     HPI Daniel Estrada is a 58 y.o. male who presents in for reported episode of memory loss.  Patient does not remember the event, discussed with his wife who reports that the patient was in the garage doing his typical workout when she and her child returned home.  Husband came into the living room where they were and was very confused and asked if he had been working out already.  Reportedly asked multiple times if he had worked out.  Patient feels well and has no complaints.  No headache.  No neuro deficits.  This is never happened before  Past Medical History:  Diagnosis Date  . BPH (benign prostatic hyperplasia)   . Elevated PSA     Patient Active Problem List   Diagnosis Date Noted  . Elevated PSA 10/07/2015  . BPH with obstruction/lower urinary tract symptoms 10/07/2015    Past Surgical History:  Procedure Laterality Date  . Back surgeries  2000/2010   ruptured discs    Prior to Admission medications   Medication Sig Start Date End Date Taking? Authorizing Provider  finasteride (PROSCAR) 5 MG tablet Take 1 tablet (5 mg total) by mouth daily. 01/10/19   McGowan, Carollee Herter A, PA-C  polyethylene glycol powder (GLYCOLAX/MIRALAX) powder See admin instructions. Reported on 01/04/2016 10/16/15   [provider]     Allergies Patient has no known allergies.  Family History  Problem Relation Age of Onset  . Kidney disease Neg Hx   . Prostate cancer Neg Hx   . Bladder Cancer Neg Hx   . Kidney cancer Neg Hx     Social History Social History   Tobacco Use  . Smoking status: Former Smoker    Packs/day: 0.50    Types: Cigarettes  . Smokeless tobacco: Former Neurosurgeon    Types: Snuff    Quit date: 11/2014  . Tobacco comment:  quit 15 years heavy smokeless tobacco user  Substance Use Topics  . Alcohol use: Yes    Alcohol/week: 0.0 standard drinks    Comment: rare  . Drug use: No    Review of Systems  Constitutional: No fever/chills Eyes: No visual changes.  ENT: No sore throat. Cardiovascular: Denies chest pain. Respiratory: Denies shortness of breath. Gastrointestinal: No abdominal pain.   Genitourinary: Negative for dysuria. Musculoskeletal: Negative for back pain. Skin: Negative for rash. Neurological: Negative for headaches or weakness   ____________________________________________   PHYSICAL EXAM:  VITAL SIGNS: ED Triage Vitals  Enc Vitals Group     BP 01/09/19 2033 (!) 158/93     Pulse Rate 01/09/19 2033 84     Resp 01/09/19 2033 17     Temp 01/09/19 2114 98 F (36.7 C)     Temp Source 01/09/19 2114 Oral     SpO2 01/09/19 2033 99 %     Weight 01/09/19 2032 90.7 kg (200 lb)     Height 01/09/19 2032 1.753 m (5\' 9" )     Head Circumference --      Peak Flow --      Pain Score 01/09/19 2032 0     Pain Loc --      Pain Edu? --      Excl. in GC? --     Constitutional: Alert and oriented. No  acute distress. Pleasant and interactive Eyes: Conjunctivae are normal.  Head: Atraumatic. Nose: No congestion/rhinnorhea. Mouth/Throat: Mucous membranes are moist.   Neck:  Painless ROM Cardiovascular: Normal rate, regular rhythm. Grossly normal heart sounds.  Good peripheral circulation. Respiratory: Normal respiratory effort.  No retractions. Lungs CTAB. Gastrointestinal: Soft and nontender. No distention.  No CVA tenderness. Genitourinary: deferred Musculoskeletal: No lower extremity tenderness nor edema.  Warm and well perfused Neurologic:  Normal speech and language. No gross focal neurologic deficits are appreciated.  Skin:  Skin is warm, dry and intact. No rash noted. Psychiatric: Mood and affect are normal. Speech and behavior are normal.  ____________________________________________    LABS (all labs ordered are listed, but only abnormal results are displayed)  Labs Reviewed  COMPREHENSIVE METABOLIC PANEL - Abnormal; Notable for the following components:      Result Value   Glucose, Bld 100 (*)    BUN 22 (*)    Creatinine, Ser 1.25 (*)    All other components within normal limits  CBC - Abnormal; Notable for the following components:   WBC 12.3 (*)    Hemoglobin 17.1 (*)    All other components within normal limits  URINALYSIS, ROUTINE W REFLEX MICROSCOPIC - Abnormal; Notable for the following components:   Color, Urine YELLOW (*)    APPearance CLEAR (*)    All other components within normal limits  CBC WITH DIFFERENTIAL/PLATELET - Abnormal; Notable for the following components:   WBC 12.3 (*)    Hemoglobin 17.2 (*)    Neutro Abs 9.7 (*)    All other components within normal limits  ETHANOL  PROTIME-INR  APTT  URINE DRUG SCREEN, QUALITATIVE ([facility] ONLY)   ____________________________________________  EKG  ED ECG REPORT I, Jene Everyobert Ziyonna Christner, the attending physician, personally viewed and interpreted this ECG.  Date: 01/15/2019  Rhythm: normal sinus rhythm QRS Axis: normal Intervals: normal ST/T Wave abnormalities: normal Narrative Interpretation: no evidence of acute ischemia  ____________________________________________  RADIOLOGY  CT head unremarkable MR brain unremarkable ____________________________________________   PROCEDURES  Procedure(s) performed: No  Procedures   Critical Care performed: No ____________________________________________   INITIAL IMPRESSION / ASSESSMENT AND PLAN / ED COURSE  Pertinent labs & imaging results that were available during my care of the patient were reviewed by me and considered in my medical decision making (see chart for details).  Patient presents with what appears to be acute onset of amnesia/memory loss, this appears to have recurred within the last 4 hours, code stroke called.  Appreciate  code stroke consultation, discussed with neurologist to feels this is unlikely to be a stroke, no indication for TPA, more consistent with transient global amnesia, recommends MRI and if unremarkable appropriate for discharge   MRI was negative for stroke, the patient remains well-appearing, appropriate for discharge    ____________________________________________   FINAL CLINICAL IMPRESSION(S) / ED DIAGNOSES  Final diagnoses:  Transient global amnesia        Note:  This document was prepared using Dragon voice recognition software and may include unintentional dictation errors.   Jene EveryKinner, Shawnmichael Parenteau, MD 01/15/19 1420

## 2019-03-20 ENCOUNTER — Other Ambulatory Visit: Payer: Self-pay | Admitting: Urology

## 2019-03-20 DIAGNOSIS — N400 Enlarged prostate without lower urinary tract symptoms: Secondary | ICD-10-CM

## 2019-03-20 MED ORDER — FINASTERIDE 5 MG PO TABS: 5.0000 mg | ORAL_TABLET | Freq: Every day | ORAL | 3 refills | Status: DC

## 2019-03-20 NOTE — Telephone Encounter (Signed)
Pt is out of Finasteride and needs a refill sent to [facility] on [address]

## 2019-03-20 NOTE — Telephone Encounter (Signed)
Patient informed, RX sent in  

## 2019-06-26 ENCOUNTER — Other Ambulatory Visit: Payer: Self-pay | Admitting: Family Medicine

## 2019-06-26 DIAGNOSIS — Z87898 Personal history of other specified conditions: Secondary | ICD-10-CM

## 2019-07-02 ENCOUNTER — Other Ambulatory Visit: Payer: Self-pay | Admitting: Urology

## 2019-07-02 ENCOUNTER — Other Ambulatory Visit: Payer: BC Managed Care – PPO

## 2019-07-02 ENCOUNTER — Other Ambulatory Visit: Payer: Self-pay

## 2019-07-02 ENCOUNTER — Other Ambulatory Visit: Payer: BLUE CROSS/BLUE SHIELD

## 2019-07-02 DIAGNOSIS — Z87898 Personal history of other specified conditions: Secondary | ICD-10-CM

## 2019-07-03 LAB — PSA: Prostate Specific Ag, Serum: 1.1 ng/mL (ref 0.0–4.0)

## 2019-07-04 ENCOUNTER — Ambulatory Visit: Payer: BLUE CROSS/BLUE SHIELD | Admitting: Urology

## 2019-07-08 NOTE — Progress Notes (Signed)
10:25 AM   Daniel Estrada 1960/10/02 [id number]  Referring provider: Dorothey Baseman, MD [phone] [address] [place],  [place] [address]  Chief Complaint  Patient presents with  . Benign Prostatic Hypertrophy    HPI: 58 yo male with a history of an elevated PSA with BPH and LU TS who presents today for a one year follow up.   History of elevated PSA Patient was found to have an elevated PSA of 4.51 ng/mL on 09/21/2015 during a well visit exam with Dr. Juanetta Gosling.  A repeated PSA at his initial visit with [place] was 3.5 on 10/06/2015.  He is not aware of any family history of prostate cancer.   Started on finasteride 5 mg daily in 09/2015.  Current PSA is 1.1 (2.2) ng/mL on 07/05/2019.    BPH WITH LUTS His IPSS score today is 11, which is moederate lower urinary tract symptomatology.  He is mostly satisfied with his quality life due to his urinary symptoms.  His previous IPSS score was 8/2.  His main complaints today are frequency and nocturia.  He denies any dysuria, hematuria or suprapubic pain.  He currently taking finasteride 5 mg daily.  He also denies any recent fevers, chills, nausea or vomiting.  He does not have a family history of PCa.  IPSS    Row Name 07/09/19 1000         International Prostate Symptom Score   How often have you had the sensation of not emptying your bladder?  Less than 1 in 5     How often have you had to urinate less than every two hours?  Less than half the time     How often have you found you stopped and started again several times when you urinated?  Less than 1 in 5 times     How often have you found it difficult to postpone urination?  Less than half the time     How often have you had a weak urinary stream?  Less than half the time     How often have you had to strain to start urination?  Less than 1 in 5 times     How many times did you typically get up at night to urinate?  2 Times     Total IPSS Score  11       Quality of Life due to urinary  symptoms   If you were to spend the rest of your life with your urinary condition just the way it is now how would you feel about that?  Mostly Satisfied        Score:  1-7 Mild 8-19 Moderate 20-35 Severe  PMH: Past Medical History:  Diagnosis Date  . BPH (benign prostatic hyperplasia)   . Elevated PSA     Surgical History: Past Surgical History:  Procedure Laterality Date  . Back surgeries  2000/2010   ruptured discs    Home Medications:  Allergies as of 07/09/2019   No Known Allergies     Medication List       Accurate as of July 09, 2019 10:25 AM. If you have any questions, ask your nurse or doctor.        finasteride 5 MG tablet Commonly known as: Proscar Take 1 tablet (5 mg total) by mouth daily.   polyethylene glycol powder 17 GM/SCOOP powder Commonly known as: GLYCOLAX/MIRALAX See admin instructions. Reported on 01/04/2016       Allergies: No Known  Allergies  Family History: Family History  Problem Relation Age of Onset  . Kidney disease Neg Hx   . Prostate cancer Neg Hx   . Bladder Cancer Neg Hx   . Kidney cancer Neg Hx     Social History:  reports that he has quit smoking. His smoking use included cigarettes. He smoked 0.50 packs per day. He quit smokeless tobacco use about 4 years ago.  His smokeless tobacco use included snuff. He reports current alcohol use. He reports that he does not use drugs.  ROS: UROLOGY Frequent Urination?: Yes Hard to postpone urination?: No Burning/pain with urination?: No Get up at night to urinate?: Yes Leakage of urine?: No Urine stream starts and stops?: No Trouble starting stream?: No Do you have to strain to urinate?: No Blood in urine?: No Urinary tract infection?: No Sexually transmitted disease?: No Injury to kidneys or bladder?: No Painful intercourse?: No Weak stream?: No Erection problems?: No Penile pain?: No  Gastrointestinal Nausea?: No Vomiting?: No Indigestion/heartburn?:  No Diarrhea?: No Constipation?: No  Constitutional Fever: No Night sweats?: No Weight loss?: No Fatigue?: No  Skin Skin rash/lesions?: No Itching?: No  Eyes Blurred vision?: No Double vision?: No  Ears/Nose/Throat Sore throat?: No Sinus problems?: No  Hematologic/Lymphatic Swollen glands?: No Easy bruising?: No  Cardiovascular Leg swelling?: No Chest pain?: No  Respiratory Cough?: No Shortness of breath?: No  Endocrine Excessive thirst?: No  Musculoskeletal Back pain?: No Joint pain?: No  Neurological Headaches?: No Dizziness?: No  Psychologic Depression?: No Anxiety?: No  Physical Exam: BP 128/81   Pulse 61   Ht 5\' 9"  (1.753 m)   Wt 210 lb 11.2 oz (95.6 kg)   BMI 31.11 kg/m   Constitutional:  Well nourished. Alert and oriented, No acute distress. HEENT: [place] AT, moist mucus membranes.  Trachea midline, no masses. Cardiovascular: No clubbing, cyanosis, or edema. Respiratory: Normal respiratory effort, no increased work of breathing. GI: Abdomen is soft, non tender, non distended, no abdominal masses. Liver and spleen not palpable.  No hernias appreciated.  Stool sample for occult testing is not indicated.   GU: No CVA tenderness.  No bladder fullness or masses.  Patient with circumcised phallus.   Urethral meatus is patent.  No penile discharge. No penile lesions or rashes. Scrotum without lesions, cysts, rashes and/or edema.  Testicles are located scrotally bilaterally. No masses are appreciated in the testicles. Left and right epididymis are normal. Rectal: Patient with  normal sphincter tone. Anus and perineum without scarring or rashes. No rectal masses are appreciated. Prostate is approximately 50 grams, no nodules are appreciated. Seminal vesicles could not be palpated.   Skin: No rashes, bruises or suspicious lesions. Lymph: No inguinal adenopathy. Neurologic: Grossly intact, no focal deficits, moving all 4 extremities. Psychiatric: Normal mood  and affect.   Laboratory Data: Lab Results  Component Value Date   WBC 12.3 (H) 01/09/2019   WBC 12.3 (H) 01/09/2019   HGB 17.1 (H) 01/09/2019   HGB 17.2 (H) 01/09/2019   HCT 50.1 01/09/2019   HCT 50.2 01/09/2019   MCV 88.7 01/09/2019   MCV 89.0 01/09/2019   PLT 254 01/09/2019   PLT 261 01/09/2019   Lab Results  Component Value Date   CREATININE 1.25 (H) 01/09/2019   Lab Results  Component Value Date   AST 27 01/09/2019   Lab Results  Component Value Date   ALT 27 01/09/2019   PSA history  4.51 ng/mL on 09/21/2015  3.50 ng/mL on 10/06/2015  1.80 ng/mL on 01/04/2016  1.20 ng/mL on 09/19/2016  1.0 ng/mL on 10/13/2017  1.1 ng/mL on 07/05/2019    Assessment & Plan:    1. History of elevated PSA Found to have an elevated PSA of 4.51 in 2017 Started finasteride 5 mg daily in 2017 Current PSA 1.1 (2.2) RTC in 12 months for PSA and exam     2. BPH with LU TS IPSS score is 11/2, it is slightly worse Continue conservative management, avoiding bladder irritants and timed voiding's Continue finasteride 5 mg daily RTC in 12 months for IPSS, PSA and exam   Return in about 1 year (around 07/08/2020) for IPSS, PSA and exam.  These notes generated with voice recognition software. I apologize for typographical errors.  Zara Council, PA-C  [facility] [address] [place], [place] [address] [phone]

## 2019-07-09 ENCOUNTER — Ambulatory Visit (INDEPENDENT_AMBULATORY_CARE_PROVIDER_SITE_OTHER): Payer: BC Managed Care – PPO | Admitting: Urology

## 2019-07-09 ENCOUNTER — Encounter: Payer: Self-pay | Admitting: Urology

## 2019-07-09 ENCOUNTER — Other Ambulatory Visit: Payer: Self-pay

## 2019-07-09 VITALS — BP 128/81 | HR 61 | Ht 69.0 in | Wt 210.7 lb

## 2019-07-09 DIAGNOSIS — N401 Enlarged prostate with lower urinary tract symptoms: Secondary | ICD-10-CM

## 2019-07-09 DIAGNOSIS — N138 Other obstructive and reflux uropathy: Secondary | ICD-10-CM

## 2019-07-09 DIAGNOSIS — Z87898 Personal history of other specified conditions: Secondary | ICD-10-CM

## 2020-03-10 ENCOUNTER — Other Ambulatory Visit: Payer: Self-pay | Admitting: Urology

## 2020-03-10 DIAGNOSIS — N400 Enlarged prostate without lower urinary tract symptoms: Secondary | ICD-10-CM

## 2020-04-14 ENCOUNTER — Ambulatory Visit: Payer: Self-pay | Attending: Internal Medicine

## 2020-04-14 DIAGNOSIS — Z23 Encounter for immunization: Secondary | ICD-10-CM

## 2020-04-14 NOTE — Progress Notes (Signed)
   Covid-19 Vaccination Clinic  Name:  Daniel Estrada    MRN: [id number] DOB: 02-20-1961  04/14/2020  Mr. Daniel Estrada was observed post Covid-19 immunization for 15 minutes without incident. He was provided with Vaccine Information Sheet and instruction to access the V-Safe system.   Mr. Daniel Estrada was instructed to call 911 with any severe reactions post vaccine: Marland Kitchen Difficulty breathing  . Swelling of face and throat  . A fast heartbeat  . A bad rash all over body  . Dizziness and weakness   Immunizations Administered    Name Date Dose VIS Date Route   Pfizer COVID-19 Vaccine 04/14/2020  2:41 PM 0.3 mL 10/09/2018 Intramuscular   Manufacturer: [employer], [employer]   Lot: [id number]   NDC: [address]-3

## 2020-05-05 ENCOUNTER — Ambulatory Visit: Payer: Self-pay | Attending: Internal Medicine

## 2020-05-05 DIAGNOSIS — Z23 Encounter for immunization: Secondary | ICD-10-CM

## 2020-05-05 NOTE — Progress Notes (Signed)
   Covid-19 Vaccination Clinic  Name:  Daniel Estrada    MRN: [id number] DOB: Nov 14, 1960  05/05/2020  [name] was observed post Covid-19 immunization for 15 minutes without incident. He was provided with Vaccine Information Sheet and instruction to access the V-Safe system.   [name] was instructed to call 911 with any severe reactions post vaccine: Marland Kitchen Difficulty breathing  . Swelling of face and throat  . A fast heartbeat  . A bad rash all over body  . Dizziness and weakness   Immunizations Administered    Name Date Dose VIS Date Route   Pfizer COVID-19 Vaccine 05/05/2020  2:26 PM 0.3 mL 10/09/2018 Intramuscular   Manufacturer: [employer], [employer]   Lot: [id number]   NDC: [address]-4

## 2020-06-29 ENCOUNTER — Other Ambulatory Visit: Payer: Self-pay

## 2020-06-29 DIAGNOSIS — N401 Enlarged prostate with lower urinary tract symptoms: Secondary | ICD-10-CM

## 2020-06-29 DIAGNOSIS — Z87898 Personal history of other specified conditions: Secondary | ICD-10-CM

## 2020-06-29 DIAGNOSIS — N138 Other obstructive and reflux uropathy: Secondary | ICD-10-CM

## 2020-06-30 ENCOUNTER — Other Ambulatory Visit: Payer: BC Managed Care – PPO

## 2020-06-30 ENCOUNTER — Other Ambulatory Visit: Payer: Self-pay

## 2020-06-30 DIAGNOSIS — N138 Other obstructive and reflux uropathy: Secondary | ICD-10-CM

## 2020-06-30 DIAGNOSIS — Z87898 Personal history of other specified conditions: Secondary | ICD-10-CM

## 2020-06-30 DIAGNOSIS — N401 Enlarged prostate with lower urinary tract symptoms: Secondary | ICD-10-CM

## 2020-07-01 LAB — PSA: Prostate Specific Ag, Serum: 0.9 ng/mL (ref 0.0–4.0)

## 2020-07-07 NOTE — Progress Notes (Signed)
9:06 AM   Daniel Estrada [birth year] [id number]  Referring provider: Juluis Pitch, MD [phone] [place],  [place] [address]  Chief Complaint  Patient presents with  . Elevated PSA    HPI: 59 yo male with a PMH of elevated PSA with BPH and LU TS who presents today for a one year follow up.    BPH WITH LUTS His IPSS score today is 9, which is moederate lower urinary tract symptomatology.  He is mostly satisfied with his quality life due to his urinary symptoms.  His previous IPSS score was 11/2.    His main complaints today are none.  Patient denies any modifying or aggravating factors.  Patient denies any gross hematuria, dysuria or suprapubic/flank pain.  Patient denies any fevers, chills, nausea or vomiting.     He currently taking finasteride 5 mg daily.    He does not have a family history of PCa.   IPSS    Row Name 07/08/20 0800         International Prostate Symptom Score   How often have you had the sensation of not emptying your bladder? Less than 1 in 5     How often have you had to urinate less than every two hours? Less than half the time     How often have you found you stopped and started again several times when you urinated? Less than 1 in 5 times     How often have you found it difficult to postpone urination? Less than 1 in 5 times     How often have you had a weak urinary stream? Less than half the time     How often have you had to strain to start urination? Not at All     How many times did you typically get up at night to urinate? 2 Times     Total IPSS Score 9       Quality of Life due to urinary symptoms   If you were to spend the rest of your life with your urinary condition just the way it is now how would you feel about that? Mostly Satisfied            Score:  1-7 Mild 8-19 Moderate 20-35 Severe  PMH: Past Medical History:  Diagnosis Date  . BPH (benign prostatic hyperplasia)   . Elevated PSA     Surgical History: Past  Surgical History:  Procedure Laterality Date  . Back surgeries  2000/2010   ruptured discs    Home Medications:  Allergies as of 07/08/2020   No Known Allergies     Medication List       Accurate as of July 08, 2020  9:06 AM. If you have any questions, ask your nurse or doctor.        STOP taking these medications   polyethylene glycol powder 17 GM/SCOOP powder Commonly known as: GLYCOLAX/MIRALAX Stopped by: Lynx Goodrich, PA-C     TAKE these medications   finasteride 5 MG tablet Commonly known as: PROSCAR TAKE 1 TABLET BY MOUTH EVERY DAY       Allergies: No Known Allergies  Family History: Family History  Problem Relation Age of Onset  . Kidney disease Neg Hx   . Prostate cancer Neg Hx   . Bladder Cancer Neg Hx   . Kidney cancer Neg Hx     Social History:  reports that he has quit smoking. His smoking use included cigarettes.  He smoked 0.50 packs per day. He quit smokeless tobacco use about 5 years ago.  His smokeless tobacco use included snuff. He reports current alcohol use. He reports that he does not use drugs.  ROS: For pertinent review of systems please refer to history of present illness  Physical Exam: BP 123/75   Pulse 60   Ht 5' 9"  (1.753 m)   Wt 206 lb (93.4 kg)   BMI 30.42 kg/m   Constitutional:  Well nourished. Alert and oriented, No acute distress. HEENT: [place] AT, mask in place.  Trachea midline Cardiovascular: No clubbing, cyanosis, or edema. Respiratory: Normal respiratory effort, no increased work of breathing. GU: No CVA tenderness.  No bladder fullness or masses.  Patient with circumcised phallus.  Urethral meatus is patent.  No penile discharge. No penile lesions or rashes. Scrotum without lesions, cysts, rashes and/or edema.  Testicles are located scrotally bilaterally. No masses are appreciated in the testicles. Left and right epididymis are normal. Rectal: Patient with  normal sphincter tone. Anus and perineum without scarring or  rashes. No rectal masses are appreciated. Prostate is approximately 50 grams, could only palpate apex and midportion of gland, no nodules are appreciated. Seminal vesicles could not be palpated Lymph: No inguinal adenopathy. Neurologic: Grossly intact, no focal deficits, moving all 4 extremities. Psychiatric: Normal mood and affect.   Laboratory Data: Cholesterol, Total 100 - 200 mg/dL 200      Triglyceride 35 - 199 mg/dL 72      HDL (High Density Lipoprotein) Cholesterol 29.0 - 71.0 mg/dL 43.4      LDL Calculated 0 - 130 mg/dL 142High      VLDL Cholesterol mg/dL 14      Cholesterol/HDL Ratio  4.6    Glucose 70 - 110 mg/dL 96      Sodium 136 - 145 mmol/L 142      Potassium 3.6 - 5.1 mmol/L 4.3      Chloride 97 - 109 mmol/L 105      Carbon Dioxide (CO2) 22.0 - 32.0 mmol/L 33.5High      Urea Nitrogen (BUN) 7 - 25 mg/dL 16      Creatinine 0.7 - 1.3 mg/dL 1.0      Glomerular Filtration Rate (eGFR), MDRD Estimate >60 mL/min/1.73sq m 77      Calcium 8.7 - 10.3 mg/dL 9.6      AST  8 - 39 U/L 21      ALT  6 - 57 U/L 23      Alk Phos (alkaline Phosphatase) 34 - 104 U/L 53      Albumin 3.5 - 4.8 g/dL 4.6      Bilirubin, Total 0.3 - 1.2 mg/dL 0.8      Protein, Total 6.1 - 7.9 g/dL 6.7      A/G Ratio 1.0 - 5.0 gm/dL 2.2    WBC (White Blood Cell Count) 4.1 - 10.2 10^3/uL 5.7      RBC (Red Blood Cell Count) 4.69 - 6.13 10^6/uL 5.61      Hemoglobin 14.1 - 18.1 gm/dL 16.9      Hematocrit 40.0 - 52.0 % 50.9      MCV (Mean Corpuscular Volume) 80.0 - 100.0 fl 90.7      MCH (Mean Corpuscular Hemoglobin) 27.0 - 31.2 pg 30.1      MCHC (Mean Corpuscular Hemoglobin Concentration) 32.0 - 36.0 gm/dL 33.2      Platelet Count 150 - 450 10^3/uL 210      RDW-CV (Red Cell  Distribution Width) 11.6 - 14.8 % 12.6      MPV (Mean Platelet Volume) 9.4 - 12.4 fl 10.3      Neutrophils 1.50 - 7.80 10^3/uL 3.90      Lymphocytes 1.00 - 3.60 10^3/uL 1.30      Mixed Count 0.10 -  0.90 10^3/uL 0.50      Neutrophil % 32.0 - 70.0 % 68.3      Lymphocyte % 10.0 - 50.0 % 23.0      Mixed % 3.0 - 14.4 % 8.7    Specimen:  Urine  Ref Range & Units 10 mo ago  Color Yellow  Yellow      Clarity Clear  Clear      Specific Gravity 1.000 - 1.030  1.025      pH, Urine 5.0 - 8.0  6.0      Protein, Urinalysis Negative, Trace mg/dL Negative      Glucose, Urinalysis Negative mg/dL Negative      Ketones, Urinalysis Negative mg/dL Negative      Blood, Urinalysis Negative  Negative      Nitrite, Urinalysis Negative  Negative      Leukocyte Esterase, Urinalysis Negative  Negative      White Blood Cells, Urinalysis None Seen, 0-3 /hpf None Seen      Red Blood Cells, Urinalysis None Seen, 0-3 /hpf None Seen      Bacteria, Urinalysis None Seen /hpf None Seen      Squamous Epithelial Cells, Urinalysis Rare, Few, None Seen /hpf None Seen     PSA history  4.51 ng/mL on 09/21/2015  1.1 ng/mL on 07/05/2019  Component     Latest Ref Rng & Units 10/06/2015 01/04/2016 09/19/2016 10/13/2017  Prostate Specific Ag, Serum     0.0 - 4.0 ng/mL 3.5 1.8 1.2 1.0   Component     Latest Ref Rng & Units 10/23/2018 07/02/2019 06/30/2020  Prostate Specific Ag, Serum     0.0 - 4.0 ng/mL 1.2 1.1 0.9    Corrected PSA value (1.8)  Assessment & Plan:    1. BPH with LU TS IPSS score is 9/2, it is improved Continue conservative management, avoiding bladder irritants and timed voiding's Continue finasteride 5 mg daily RTC in 12 months for IPSS, PSA and exam   Return in about 1 year (around 07/08/2021) for IPSS, PSA and exam.  These notes generated with voice recognition software. I apologize for typographical errors.  Zara Council, PA-C  [facility] [address] [place], [place] [address] [phone]

## 2020-07-08 ENCOUNTER — Other Ambulatory Visit: Payer: Self-pay

## 2020-07-08 ENCOUNTER — Ambulatory Visit (INDEPENDENT_AMBULATORY_CARE_PROVIDER_SITE_OTHER): Payer: BC Managed Care – PPO | Admitting: Urology

## 2020-07-08 ENCOUNTER — Encounter: Payer: Self-pay | Admitting: Urology

## 2020-07-08 VITALS — BP 123/75 | HR 60 | Ht 69.0 in | Wt 206.0 lb

## 2020-07-08 DIAGNOSIS — N138 Other obstructive and reflux uropathy: Secondary | ICD-10-CM | POA: Diagnosis not present

## 2020-07-08 DIAGNOSIS — N401 Enlarged prostate with lower urinary tract symptoms: Secondary | ICD-10-CM | POA: Diagnosis not present

## 2020-11-28 IMAGING — CT CT HEAD CODE STROKE W/O CM
3 series · 15 of 47 positions shown, 18 images · non-contrast
Comparison: None.

CLINICAL DATA: Code stroke. Initial evaluation for acute memory
loss.

EXAM:
CT HEAD WITHOUT CONTRAST
TECHNIQUE: Contiguous axial images were obtained from the base of the skull
through the vertex without intravenous contrast.

[Series 2: head wo · axial · 0.43mm/px · z∈[-161,-36]mm · 9 of 31 slices shown, 12 images]
[im 3/31  brain]
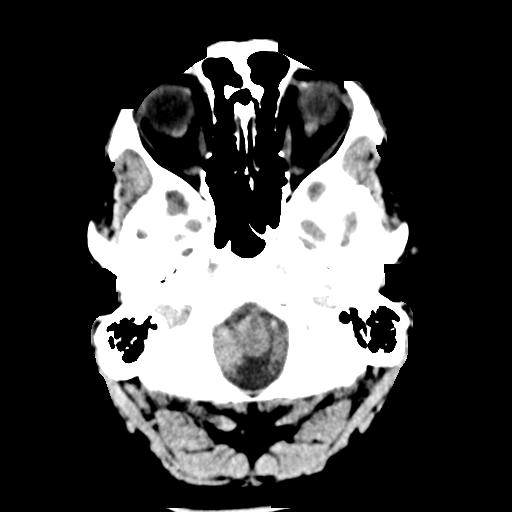
[im 3/31  bone]
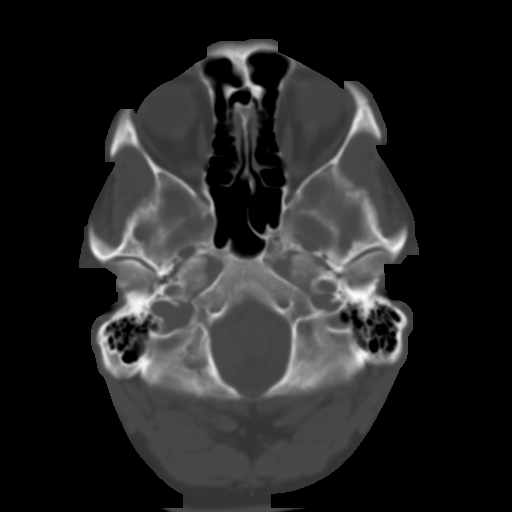
[im 6/31  brain]
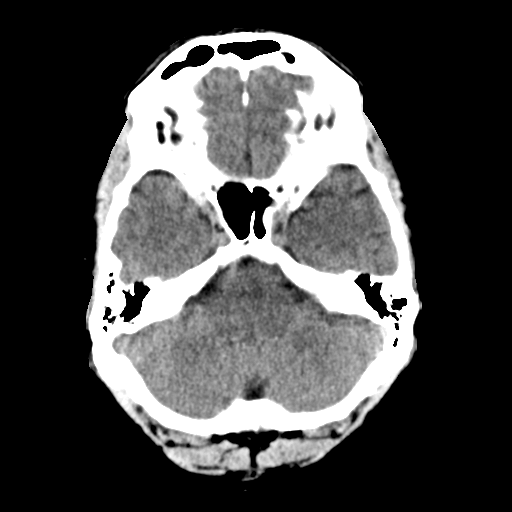
[im 9/31  brain]
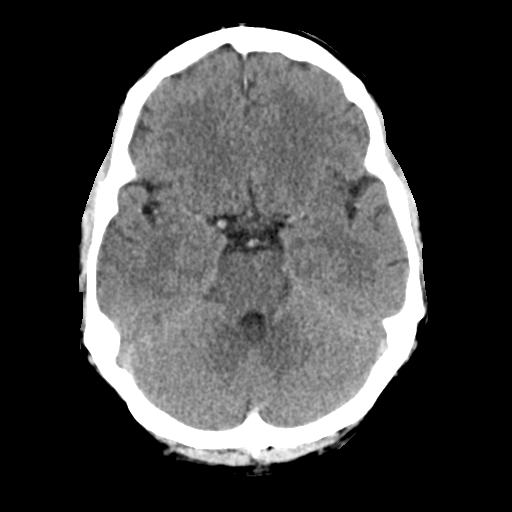
[im 12/31  brain]
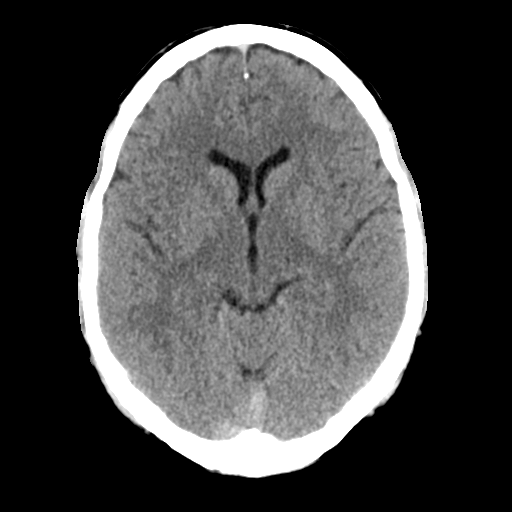
[im 16/31  brain]
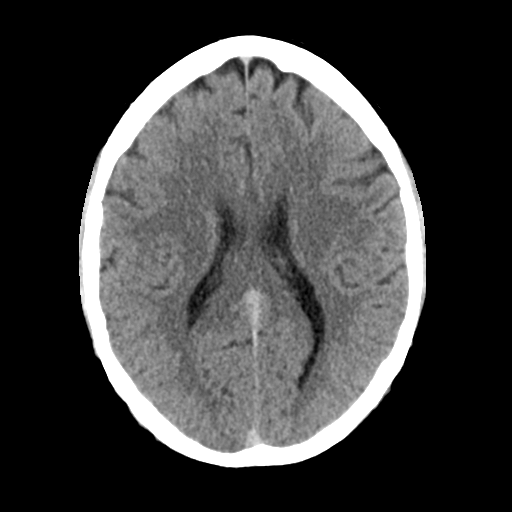
[im 16/31  bone]
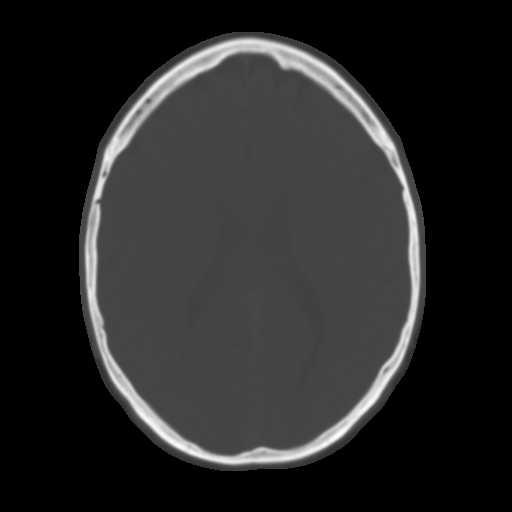
[im 19/31  brain]
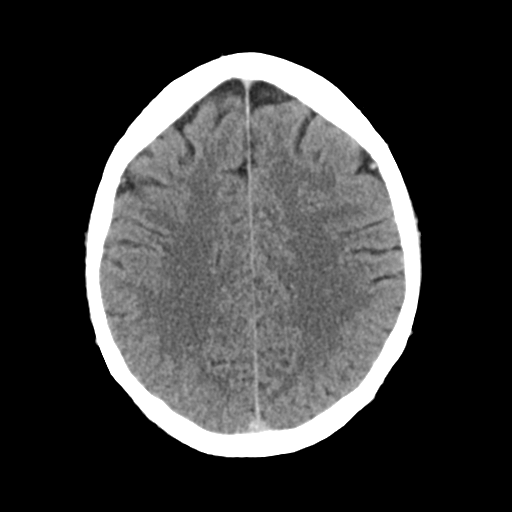
[im 22/31  brain]
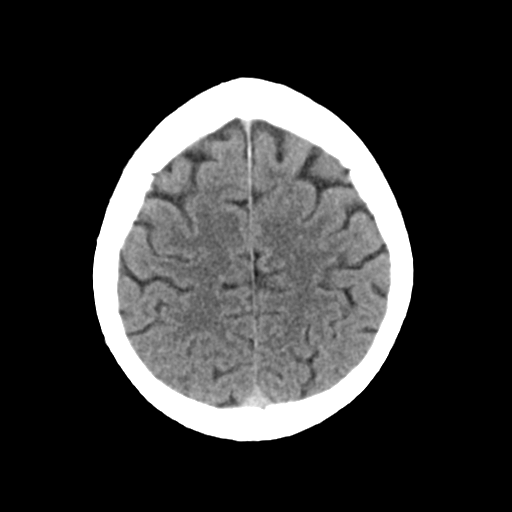
[im 25/31  brain]
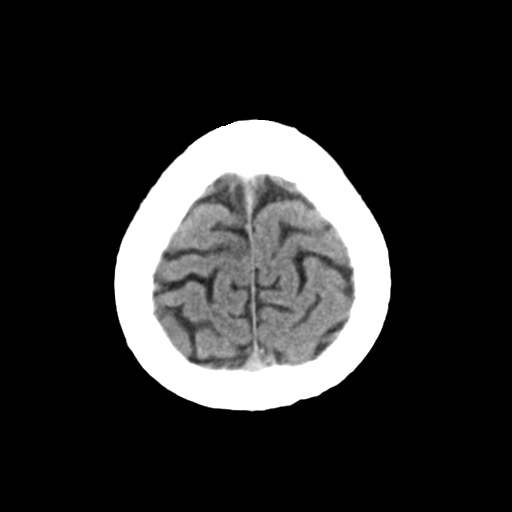
[im 28/31  brain]
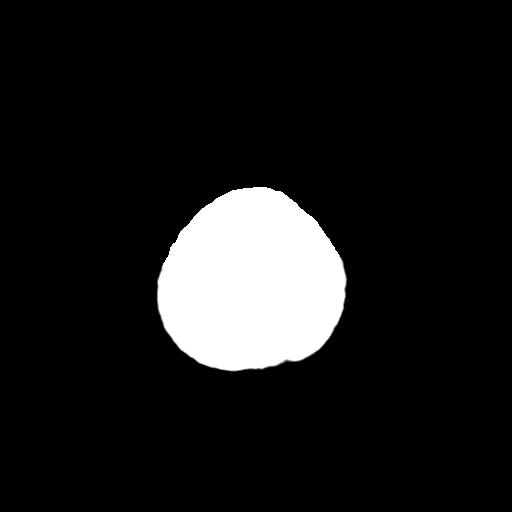
[im 28/31  bone]
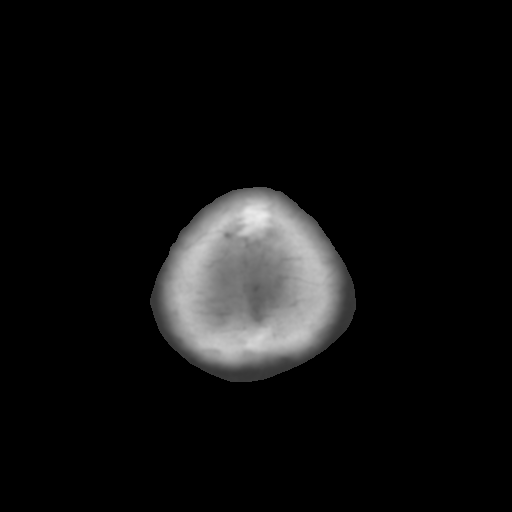

[Series 4: coronal soft tissue · coronal · 0.31mm/px · 3 of 65 slices shown]
[im 22/65  brain]
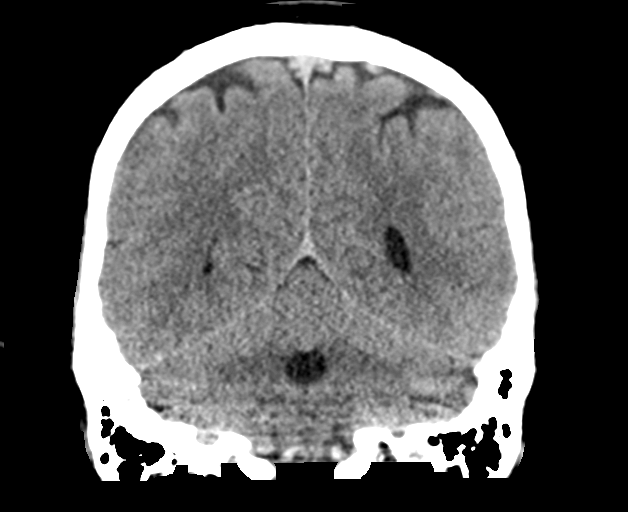
[im 29/65  brain]
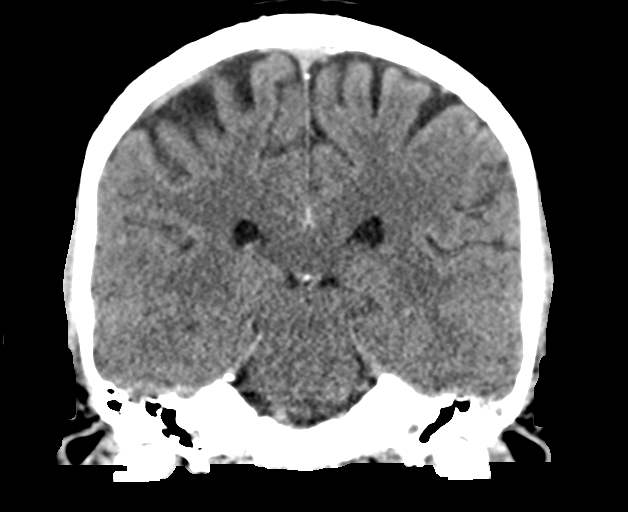
[im 36/65  brain]
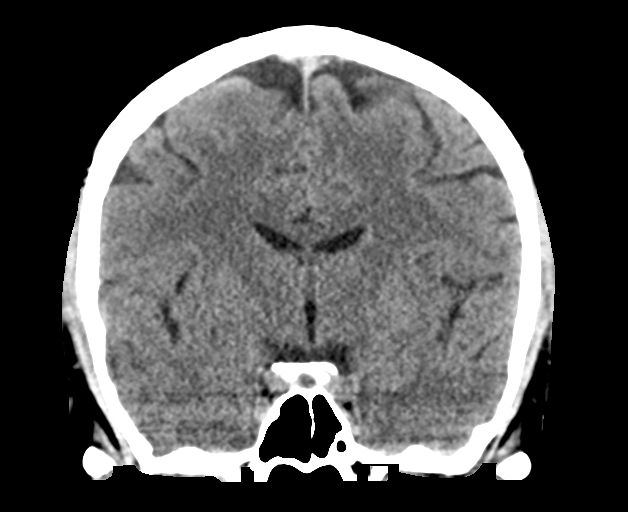

[Series 5: sagittal soft tissue · sagittal · 0.31mm/px · 3 of 54 slices shown]
[im 18/54  brain]
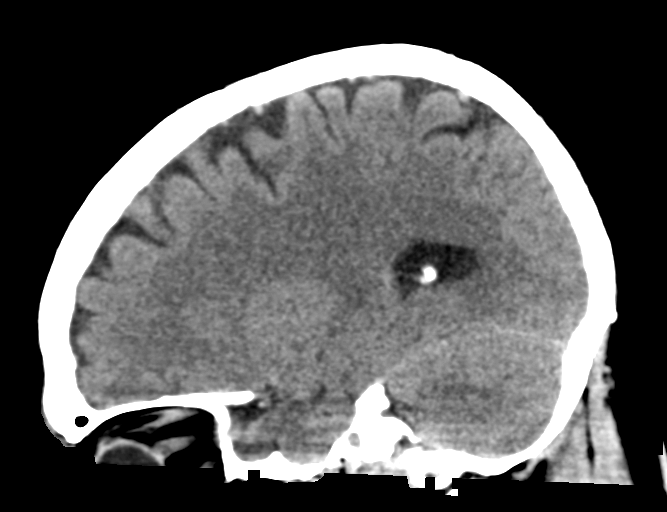
[im 27/54  brain]
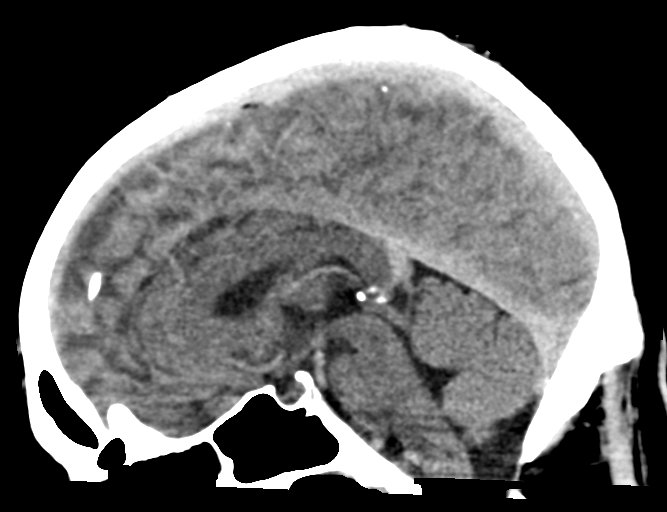
[im 36/54  brain]
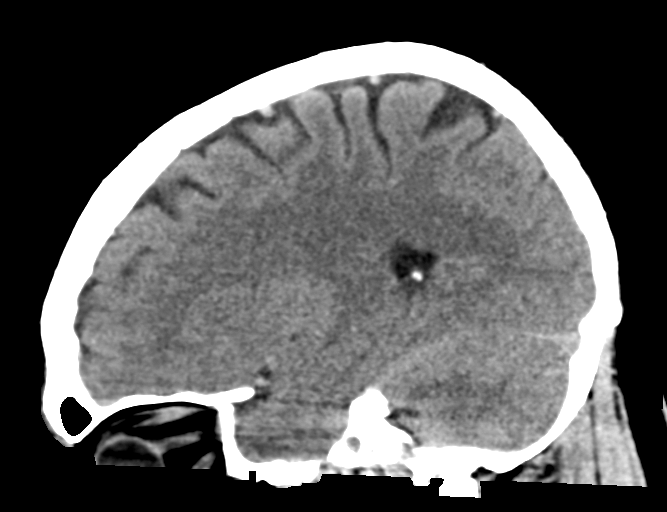

[15 of 47 positions shown; findings below may reference images not displayed]

FINDINGS: Brain: Cerebral volume within normal limits for patient age.

No evidence for acute intracranial hemorrhage. No findings to
suggest acute large vessel territory infarct. No mass lesion,
midline shift, or mass effect. Ventricles are normal in size without
evidence for hydrocephalus. No extra-axial fluid collection
identified.

Vascular: No hyperdense vessel identified.

Skull: Scalp soft tissues demonstrate no acute abnormality.
Calvarium intact.

Sinuses/Orbits: Globes and orbital soft tissues within normal
limits.

Visualized paranasal sinuses are clear. No mastoid effusion.

ASPECTS (Alberta Stroke Program Early CT Score)

- Ganglionic level infarction (caudate, lentiform nuclei, internal
capsule, insula, M1-M3 cortex): 7

- Supraganglionic infarction (M4-M6 cortex): 3

Total score (0-10 with 10 being normal): 10
IMPRESSION: 1. Normal head CT.  No acute intracranial abnormality.
2. ASPECTS is 10.

These results were communicated to Dr. Gjest at [DATE] pmon
01/09/2019by text page via the AMION messaging system.

## 2021-03-16 ENCOUNTER — Other Ambulatory Visit: Payer: Self-pay | Admitting: Urology

## 2021-03-16 DIAGNOSIS — N400 Enlarged prostate without lower urinary tract symptoms: Secondary | ICD-10-CM

## 2021-06-28 ENCOUNTER — Other Ambulatory Visit: Payer: Self-pay

## 2021-06-28 DIAGNOSIS — Z87898 Personal history of other specified conditions: Secondary | ICD-10-CM

## 2021-07-02 ENCOUNTER — Other Ambulatory Visit: Payer: Self-pay

## 2021-07-02 DIAGNOSIS — Z0283 Encounter for blood-alcohol and blood-drug test: Secondary | ICD-10-CM

## 2021-07-02 NOTE — Progress Notes (Signed)
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  [facility] Specimen #:  [id number] [facility] Acct #:  [id number]  On-site Rapid Pre-employment Drug Screen Results = Negative  AMD

## 2021-07-05 ENCOUNTER — Other Ambulatory Visit: Payer: Self-pay

## 2021-07-05 ENCOUNTER — Encounter: Payer: Self-pay | Admitting: Urology

## 2021-07-07 ENCOUNTER — Ambulatory Visit: Payer: Self-pay | Admitting: Urology

## 2021-07-21 ENCOUNTER — Telehealth: Payer: Self-pay

## 2021-07-21 NOTE — Telephone Encounter (Signed)
Rec'd email from [employer]) that [place] is interested in receiving Hep B vaccine.  Tried to call Dorinda Hill.  Went straight to voice mail.  Left a call back message.  Charm Barges,

## 2021-08-17 ENCOUNTER — Other Ambulatory Visit: Payer: Self-pay

## 2021-08-18 NOTE — Progress Notes (Signed)
4:01 PM   Daniel Estrada July 31, 1961 [id number]  Referring provider: Juluis Pitch, MD [phone] [place],  [place] [address]  Chief Complaint  Patient presents with   Benign Prostatic Hypertrophy   Urological history: 1.  Elevated PSA -PSA Trend  4.51 in 2017  3.5 in 2017 Component     Latest Ref Rng & Units 09/19/2016 10/13/2017 10/23/2018 07/02/2019  Prostate Specific Ag, Serum     0.0 - 4.0 ng/mL 1.2 1.0 1.2 1.1   Component     Latest Ref Rng & Units 06/30/2020  Prostate Specific Ag, Serum     0.0 - 4.0 ng/mL 0.9   2. BPH with LU TS -PSA pending -I PSS 9/2 -Managed with finasteride 5 mg daily  HPI: Daniel Estrada is a 61 y.o. male who presents today for a one year follow up.   He has been without the finasteride for a month.  He has noticed the need to return to the restroom shortly after urinating on occasion.  Patient denies any modifying or aggravating factors.  Patient denies any gross hematuria, dysuria or suprapubic/flank pain.  Patient denies any fevers, chills, nausea or vomiting.     IPSS     Row Name 08/19/21 1500         International Prostate Symptom Score   How often have you had the sensation of not emptying your bladder? Less than half the time     How often have you had to urinate less than every two hours? Less than half the time     How often have you found you stopped and started again several times when you urinated? Less than 1 in 5 times     How often have you found it difficult to postpone urination? Less than 1 in 5 times     How often have you had a weak urinary stream? Less than 1 in 5 times     How often have you had to strain to start urination? Not at All     How many times did you typically get up at night to urinate? 2 Times     Total IPSS Score 9       Quality of Life due to urinary symptoms   If you were to spend the rest of your life with your urinary condition just the way it is now how would you feel about that? Mostly  Satisfied               Score:  1-7 Mild 8-19 Moderate 20-35 Severe  PMH: Past Medical History:  Diagnosis Date   BPH (benign prostatic hyperplasia)    Elevated PSA     Surgical History: Past Surgical History:  Procedure Laterality Date   Back surgeries  2000/2010   ruptured discs    Home Medications:  Allergies as of 08/19/2021   No Known Allergies      Medication List        Accurate as of August 19, 2021  4:01 PM. If you have any questions, ask your nurse or doctor.          finasteride 5 MG tablet Commonly known as: PROSCAR TAKE 1 TABLET BY MOUTH EVERY DAY        Allergies: No Known Allergies  Family History: Family History  Problem Relation Age of Onset   Kidney disease Neg Hx    Prostate cancer Neg Hx    Bladder Cancer Neg Hx  Kidney cancer Neg Hx     Social History:  reports that he has quit smoking. His smoking use included cigarettes. He smoked an average of .5 packs per day. He quit smokeless tobacco use about 6 years ago.  His smokeless tobacco use included snuff. He reports current alcohol use. He reports that he does not use drugs.  ROS: For pertinent review of systems please refer to history of present illness  Physical Exam: BP 130/72    Pulse 66    Ht 5\' 9"  (1.753 m)    Wt 197 lb (89.4 kg)    BMI 29.09 kg/m   Constitutional:  Well nourished. Alert and oriented, No acute distress. HEENT: [place] AT, mask in place.  Trachea midline Cardiovascular: No clubbing, cyanosis, or edema. Respiratory: Normal respiratory effort, no increased work of breathing. GU: No CVA tenderness.  No bladder fullness or masses.  Patient with circumcised phallus.  Urethral meatus is patent.  No penile discharge. No penile lesions or rashes. Scrotum without lesions, cysts, rashes and/or edema.  Testicles are located scrotally bilaterally. No masses are appreciated in the testicles. Left and right epididymis are normal. Rectal: Patient with  normal sphincter  tone. Anus and perineum without scarring or rashes. No rectal masses are appreciated. Prostate is approximately 45 grams, could only palpate the apex and the midportion of the gland, no nodules are appreciated. Seminal vesicles could not be palpated Neurologic: Grossly intact, no focal deficits, moving all 4 extremities. Psychiatric: Normal mood and affect.   Laboratory Data: Labs pending  Pertinent Imaging N/A  Assessment & Plan:    1. BPH with LU TS -PSA pending -continue conservative management, avoiding bladder irritants and timed voiding's -We will hold on starting finasteride at this time until PSA results are available  -If PSA remains low, we will not restart the finasteride and recheck PSA in 6 months. -If PSA increases, we will restart the finasteride and recheck PSA in 6 months  Return in about 6 months (around 02/16/2022) for PSA only .  These notes generated with voice recognition software. I apologize for typographical errors.  Zara Council, PA-C  [facility] [address] [place], [place] [address] [phone]

## 2021-08-19 ENCOUNTER — Encounter: Payer: Self-pay | Admitting: Urology

## 2021-08-19 ENCOUNTER — Ambulatory Visit (INDEPENDENT_AMBULATORY_CARE_PROVIDER_SITE_OTHER): Payer: 59 | Admitting: Urology

## 2021-08-19 ENCOUNTER — Other Ambulatory Visit: Payer: Self-pay

## 2021-08-19 VITALS — BP 130/72 | HR 66 | Ht 69.0 in | Wt 197.0 lb

## 2021-08-19 DIAGNOSIS — N138 Other obstructive and reflux uropathy: Secondary | ICD-10-CM | POA: Diagnosis not present

## 2021-08-19 DIAGNOSIS — N401 Enlarged prostate with lower urinary tract symptoms: Secondary | ICD-10-CM | POA: Diagnosis not present

## 2021-08-20 ENCOUNTER — Other Ambulatory Visit: Payer: Self-pay | Admitting: Urology

## 2021-08-20 DIAGNOSIS — N400 Enlarged prostate without lower urinary tract symptoms: Secondary | ICD-10-CM

## 2021-08-20 LAB — PSA: Prostate Specific Ag, Serum: 3 ng/mL (ref 0.0–4.0)

## 2021-08-20 MED ORDER — FINASTERIDE 5 MG PO TABS: 5.0000 mg | ORAL_TABLET | Freq: Every day | ORAL | 3 refills | Status: DC

## 2021-11-15 ENCOUNTER — Other Ambulatory Visit: Payer: 59

## 2021-11-15 DIAGNOSIS — Z87898 Personal history of other specified conditions: Secondary | ICD-10-CM | POA: Diagnosis not present

## 2021-11-16 LAB — PSA: Prostate Specific Ag, Serum: 1.4 ng/mL (ref 0.0–4.0)

## 2022-01-11 ENCOUNTER — Encounter: Payer: Self-pay | Admitting: Physician Assistant

## 2022-02-02 ENCOUNTER — Ambulatory Visit: Payer: Self-pay

## 2022-02-02 DIAGNOSIS — Z23 Encounter for immunization: Secondary | ICD-10-CM

## 2022-02-02 DIAGNOSIS — Z0000 Encounter for general adult medical examination without abnormal findings: Secondary | ICD-10-CM

## 2022-02-02 LAB — POCT URINALYSIS DIPSTICK
Bilirubin, UA: NEGATIVE
Blood, UA: NEGATIVE
Glucose, UA: NEGATIVE
Ketones, UA: NEGATIVE
Leukocytes, UA: NEGATIVE
Nitrite, UA: NEGATIVE
Protein, UA: NEGATIVE
Spec Grav, UA: 1.02 (ref 1.010–1.025)
Urobilinogen, UA: 0.2 E.U./dL
pH, UA: 6 (ref 5.0–8.0)

## 2022-02-02 NOTE — Progress Notes (Signed)
Pt presents today for physical labs, will return to clinic for scheduled physical.  

## 2022-02-03 LAB — CMP12+LP+TP+TSH+6AC+PSA+CBC…
ALT: 20 IU/L (ref 0–44)
AST: 24 IU/L (ref 0–40)
Albumin/Globulin Ratio: 2 (ref 1.2–2.2)
Albumin: 4.5 g/dL (ref 3.8–4.9)
Alkaline Phosphatase: 69 IU/L (ref 44–121)
BUN/Creatinine Ratio: 14 (ref 9–20)
BUN: 15 mg/dL (ref 6–24)
Basophils Absolute: 0.1 10*3/uL (ref 0.0–0.2)
Basos: 1 %
Bilirubin Total: 0.4 mg/dL (ref 0.0–1.2)
Calcium: 9.6 mg/dL (ref 8.7–10.2)
Chloride: 103 mmol/L (ref 96–106)
Chol/HDL Ratio: 4 ratio (ref 0.0–5.0)
Cholesterol, Total: 178 mg/dL (ref 100–199)
Creatinine, Ser: 1.04 mg/dL (ref 0.76–1.27)
EOS (ABSOLUTE): 0.1 10*3/uL (ref 0.0–0.4)
Eos: 2 %
Estimated CHD Risk: 0.7 times avg. (ref 0.0–1.0)
Free Thyroxine Index: 2.3 (ref 1.2–4.9)
GGT: 14 IU/L (ref 0–65)
Globulin, Total: 2.2 g/dL (ref 1.5–4.5)
Glucose: 93 mg/dL (ref 70–99)
HDL: 45 mg/dL (ref >39)
Hematocrit: 48 % (ref 37.5–51.0)
Hemoglobin: 16.8 g/dL (ref 13.0–17.7)
Immature Grans (Abs): 0.1 10*3/uL (ref 0.0–0.1)
Immature Granulocytes: 1 %
Iron: 102 ug/dL (ref 38–169)
LDH: 156 IU/L (ref 121–224)
LDL Chol Calc (NIH): 117 mg/dL — ABNORMAL HIGH (ref 0–99)
Lymphocytes Absolute: 1.4 10*3/uL (ref 0.7–3.1)
Lymphs: 20 %
MCH: 31.5 pg (ref 26.6–33.0)
MCHC: 35 g/dL (ref 31.5–35.7)
MCV: 90 fL (ref 79–97)
Monocytes Absolute: 0.3 10*3/uL (ref 0.1–0.9)
Monocytes: 4 %
Neutrophils Absolute: 4.9 10*3/uL (ref 1.4–7.0)
Neutrophils: 72 %
Phosphorus: 3.5 mg/dL (ref 2.8–4.1)
Platelets: 206 10*3/uL (ref 150–450)
Potassium: 4.4 mmol/L (ref 3.5–5.2)
Prostate Specific Ag, Serum: 1.2 ng/mL (ref 0.0–4.0)
RBC: 5.33 x10E6/uL (ref 4.14–5.80)
RDW: 12.8 % (ref 11.6–15.4)
Sodium: 141 mmol/L (ref 134–144)
T3 Uptake Ratio: 27 % (ref 24–39)
T4, Total: 8.5 ug/dL (ref 4.5–12.0)
TSH: 1.39 u[IU]/mL (ref 0.450–4.500)
Total Protein: 6.7 g/dL (ref 6.0–8.5)
Triglycerides: 85 mg/dL (ref 0–149)
Uric Acid: 6.2 mg/dL (ref 3.8–8.4)
VLDL Cholesterol Cal: 16 mg/dL (ref 5–40)
WBC: 6.8 10*3/uL (ref 3.4–10.8)
eGFR: 83 mL/min/{1.73_m2} (ref >59)

## 2022-02-09 ENCOUNTER — Encounter: Payer: Self-pay | Admitting: Physician Assistant

## 2022-02-09 ENCOUNTER — Ambulatory Visit: Payer: Self-pay | Admitting: Physician Assistant

## 2022-02-09 VITALS — BP 119/72 | HR 70 | Temp 97.7°F | Resp 12 | Ht 69.0 in | Wt 201.0 lb

## 2022-02-09 DIAGNOSIS — Z0000 Encounter for general adult medical examination without abnormal findings: Secondary | ICD-10-CM

## 2022-02-09 NOTE — Progress Notes (Signed)
[facility]  ____________________________________________   None    (approximate)  I have reviewed the triage vital signs and the nursing notes.   HISTORY  Chief Complaint Annual Exam    HPI Daniel Estrada is a 61 y.o. male patient presents for annual physical exam.  Voices no concerns or complaints.         Past Medical History:  Diagnosis Date   BPH (benign prostatic hyperplasia)    Elevated PSA     Patient Active Problem List   Diagnosis Date Noted   Elevated PSA 10/07/2015   BPH with obstruction/lower urinary tract symptoms 10/07/2015    Past Surgical History:  Procedure Laterality Date   Back surgeries  2000/2010   ruptured discs    Prior to Admission medications   Medication Sig Start Date End Date Taking? Authorizing Provider  finasteride (PROSCAR) 5 MG tablet Take 1 tablet (5 mg total) by mouth daily. 08/20/21   Zara Council A, PA-C  sildenafil (REVATIO) 20 MG tablet 1-5 po prn 09/02/19   [provider]    Allergies Patient has no known allergies.  Family History  Problem Relation Age of Onset   Kidney disease Neg Hx    Prostate cancer Neg Hx    Bladder Cancer Neg Hx    Kidney cancer Neg Hx     Social History Social History   Tobacco Use   Smoking status: Former    Packs/day: 0.50    Types: Cigarettes   Smokeless tobacco: Former    Types: Snuff    Quit date: 11/2014   Tobacco comments:    quit 15 years heavy smokeless tobacco user  Vaping Use   Vaping Use: Never used  Substance Use Topics   Alcohol use: Yes    Alcohol/week: 0.0 standard drinks of alcohol    Comment: rare   Drug use: No    Review of Systems Constitutional: No fever/chills Eyes: No visual changes. ENT: No sore throat. Cardiovascular: Denies chest pain. Respiratory: Denies shortness of breath. Gastrointestinal: No abdominal pain.  No nausea, no vomiting.  No diarrhea.  No constipation. Genitourinary: Negative  for dysuria.  Erectile dysfunction and BPH. Musculoskeletal: Negative for back pain. Skin: Negative for rash. Neurological: Negative for headaches, focal weakness or numbness.   ____________________________________________   PHYSICAL EXAM:  VITAL SIGNS: BP is 119/72, pulse 70, respiration 12, temp 97.7, and patient 97% O2 sat on room air.  Patient weighs 201 pounds and BMI is 29.68. Constitutional: Alert and oriented. Well appearing and in no acute distress. Eyes: Conjunctivae are normal. PERRL. EOMI. Head: Atraumatic. Nose: No congestion/rhinnorhea. Mouth/Throat: Mucous membranes are moist.  Oropharynx non-erythematous. Neck: No stridor.  No cervical spine tenderness to palpation. Hematological/Lymphatic/Immunilogical: No cervical lymphadenopathy. Cardiovascular: Normal rate, regular rhythm. Grossly normal heart sounds.  Good peripheral circulation. Respiratory: Normal respiratory effort.  No retractions. Lungs CTAB. Gastrointestinal: Soft and nontender. No distention. No abdominal bruits. No CVA tenderness. Genitourinary: Deferred Musculoskeletal: No lower extremity tenderness nor edema.  No joint effusions. Neurologic:  Normal speech and language. No gross focal neurologic deficits are appreciated. No gait instability. Skin:  Skin is warm, dry and intact. No rash noted. Psychiatric: Mood and affect are normal. Speech and behavior are normal.  ____________________________________________   LABS       Component Ref Range & Units 7 d ago 3 yr ago 13 yr ago  Color, UA  yellow     Clarity, UA  clear  Glucose, UA Negative Negative     Bilirubin, UA  negative     Ketones, UA  negative     Spec Grav, UA 1.010 - 1.025 1.020     Blood, UA  negative     pH, UA 5.0 - 8.0 6.0     Protein, UA Negative Negative     Urobilinogen, UA 0.2 or 1.0 E.U./dL 0.2   0.2 R   Nitrite, UA  negative     Leukocytes, UA Negative Negative      NEGATIVE MICROSCOPIC NOT DONE ON URINES WITH NEGATIVE  PROTEIN, BLOOD, LEUKOCYTES, NITRITE, OR GLUCOSE <1000 mg/dL. R    Appearance   CLEAR Abnormal  R  CLEAR   Odor      Resulting Agency   [facility]                  Other Results from 02/02/2022   Contains abnormal data CMP12+LP+TP+TSH+6AC+PSA+CBC. Order: [id number] Status: Final result    Visible to patient: Yes (seen)    Next appt: 02/17/2022 at 03:30 PM in Urology [facility], PA-C)    Dx: Routine adult health maintenance    0 Result Notes            Component Ref Range & Units 7 d ago (02/02/22) 2 mo ago (11/15/21) 5 mo ago (08/19/21) 1 yr ago (06/30/20) 2 yr ago (07/02/19) 3 yr ago (01/09/19) 3 yr ago (01/09/19) 3 yr ago (01/09/19)  Glucose 70 - 99 mg/dL 93       100 High     Uric Acid 3.8 - 8.4 mg/dL 6.2          Comment:            Therapeutic target for gout patients: <6.0  BUN 6 - 24 mg/dL 15       22 High  R    Creatinine, Ser 0.76 - 1.27 mg/dL 1.04       1.25 High  R    eGFR >59 mL/min/1.73 83          BUN/Creatinine Ratio 9 - 20 14          Sodium 134 - 144 mmol/L 141       140 R    Potassium 3.5 - 5.2 mmol/L 4.4       4.5 R    Chloride 96 - 106 mmol/L 103       105 R    Calcium 8.7 - 10.2 mg/dL 9.6       9.6 R    Phosphorus 2.8 - 4.1 mg/dL 3.5          Total Protein 6.0 - 8.5 g/dL 6.7       7.6 R    Albumin 3.8 - 4.9 g/dL 4.5       5.0 R    Globulin, Total 1.5 - 4.5 g/dL 2.2          Albumin/Globulin Ratio 1.2 - 2.2 2.0          Bilirubin Total 0.0 - 1.2 mg/dL 0.4       0.7 R    Alkaline Phosphatase 44 - 121 IU/L 69       53 R    LDH 121 - 224 IU/L 156          AST 0 - 40 IU/L 24       27 R    ALT 0 - 44 IU/L 20  27 R    GGT 0 - 65 IU/L 14          Iron 38 - 169 ug/dL 102          Cholesterol, Total 100 - 199 mg/dL 178          Triglycerides 0 - 149 mg/dL 85          HDL >39 mg/dL 45          VLDL Cholesterol Cal 5 - 40 mg/dL 16          LDL Chol Calc (NIH) 0 - 99 mg/dL 117 High           Chol/HDL Ratio 0.0 - 5.0 ratio 4.0           Comment:                                   T. Chol/HDL Ratio                                              Men  Women                                1/2 Avg.Risk  3.4    3.3                                    Avg.Risk  5.0    4.4                                 2X Avg.Risk  9.6    7.1                                 3X Avg.Risk 23.4   11.0   Estimated CHD Risk 0.0 - 1.0 times avg. 0.7          Comment: The CHD Risk is based on the T. Chol/HDL ratio. Other  factors affect CHD Risk such as hypertension, smoking,  diabetes, severe obesity, and family history of  premature CHD.   TSH 0.450 - 4.500 uIU/mL 1.390          T4, Total 4.5 - 12.0 ug/dL 8.5          T3 Uptake Ratio 24 - 39 % 27          Free Thyroxine Index 1.2 - 4.9 2.3          Prostate Specific Ag, Serum 0.0 - 4.0 ng/mL 1.2  1.4 CM  3.0 CM  0.9 CM  1.1 CM      Comment: Roche ECLIA methodology.  According to the American Urological Association, Serum PSA should  decrease and remain at undetectable levels after radical  prostatectomy. The AUA defines biochemical recurrence as an initial  PSA value 0.2 ng/mL or greater followed by a subsequent confirmatory  PSA value 0.2 ng/mL or greater.  Values obtained with different assay methods or kits cannot be used  interchangeably. Results cannot be interpreted as absolute evidence  of the presence or  absence of malignant disease.   WBC 3.4 - 10.8 x10E3/uL 6.8      12.3 High  R   12.3 High  R   RBC 4.14 - 5.80 x10E6/uL 5.33      5.64 R   5.65 R   Hemoglobin 13.0 - 17.7 g/dL 16.8      17.2 High  R   17.1 High  R   Hematocrit 37.5 - 51.0 % 48.0      50.2 R   50.1 R   MCV 79 - 97 fL 90      89.0 R   88.7 R   MCH 26.6 - 33.0 pg 31.5      30.5 R   30.3 R   MCHC 31.5 - 35.7 g/dL 35.0      34.3 R   34.1 R   RDW 11.6 - 15.4 % 12.8      12.3 R   12.2 R   Platelets 150 - 450 x10E3/uL 206      261 R   254 R   Neutrophils Not Estab. % 72      78 R     Lymphs Not Estab. % 20          Monocytes  Not Estab. % 4          Eos Not Estab. % 2          Basos Not Estab. % 1          Neutrophils Absolute 1.4 - 7.0 x10E3/uL 4.9      9.7 High  R     Lymphocytes Absolute 0.7 - 3.1 x10E3/uL 1.4      1.8 R     Monocytes Absolute 0.1 - 0.9 x10E3/uL 0.3          EOS (ABSOLUTE) 0.0 - 0.4 x10E3/uL 0.1          Basophils Absolute 0.0 - 0.2 x10E3/uL 0.1      0.1 R     Immature Granulocytes Not Estab. % 1      0 R     Immature Grans (              ____________________________________________  EKG  Sinus  Rhythm at 66 bpm WITHIN NORMAL LIMITS  ____________________________________________     ____________________________________________   INITIAL IMPRESSION / ASSESSMENT AND PLAN   As part of my medical decision making, I reviewed the following data within the electronic MEDICAL RECORD NUMBER Notes from prior ED visits and [place] Controlled Substance Database     Discussed lab and EKG results with patient.  Patient advised continue previous medication follow-up as necessary.        ____________________________________________   FINAL CLINICAL IMPRESSION  Well exam  ED Discharge Orders     None        Note:  This document was prepared using Dragon voice recognition software and may include unintentional dictation errors.

## 2022-02-17 ENCOUNTER — Encounter: Payer: Self-pay | Admitting: Urology

## 2022-02-17 ENCOUNTER — Ambulatory Visit (INDEPENDENT_AMBULATORY_CARE_PROVIDER_SITE_OTHER): Payer: 59 | Admitting: Urology

## 2022-02-17 ENCOUNTER — Other Ambulatory Visit: Payer: 59

## 2022-02-17 DIAGNOSIS — N400 Enlarged prostate without lower urinary tract symptoms: Secondary | ICD-10-CM

## 2022-02-17 NOTE — Progress Notes (Signed)
Error

## 2022-02-18 LAB — PSA: Prostate Specific Ag, Serum: 1.2 ng/mL (ref 0.0–4.0)

## 2022-03-09 ENCOUNTER — Ambulatory Visit: Payer: 59 | Admitting: Urology

## 2022-03-09 NOTE — Progress Notes (Incomplete)
03/09/22 7:25 AM   Daniel Estrada 05-11-1961 [id number]  Referring provider:  Dorothey Baseman, MD [phone] [place],  [place] [address] No chief complaint on file.   Urological history  1.  Elevated PSA -PSA Trend             4.51 in 2017             3.5 in 2017     Prostate Specific Ag, Serum  Latest Ref Rng 0.0 - 4.0 ng/mL  10/13/2017 1.0   10/23/2018 1.2   07/02/2019 1.1   06/30/2020 0.9   08/19/2021 3.0   11/15/2021 1.4   02/02/2022 1.2   02/17/2022 1.2    2. BPH with LU TS -PSA *** -I PSS *** -Managed with finasteride 5 mg daily     HPI: Daniel Estrada is a 61 y.o.male who presents today for follow-up with PSA.       PMH: Past Medical History:  Diagnosis Date   BPH (benign prostatic hyperplasia)    Elevated PSA     Surgical History: Past Surgical History:  Procedure Laterality Date   Back surgeries  2000/2010   ruptured discs    Home Medications:  Allergies as of 03/09/2022   No Known Allergies      Medication List        Accurate as of March 09, 2022  7:25 AM. If you have any questions, ask your nurse or doctor.          finasteride 5 MG tablet Commonly known as: PROSCAR Take 1 tablet (5 mg total) by mouth daily.   FISH OIL PO Take by mouth.   MULTI-VITAMIN DAILY PO Take by mouth.   sildenafil 20 MG tablet Commonly known as: REVATIO        Allergies:  No Known Allergies  Family History: Family History  Problem Relation Age of Onset   Kidney disease Neg Hx    Prostate cancer Neg Hx    Bladder Cancer Neg Hx    Kidney cancer Neg Hx     Social History:  reports that he has quit smoking. His smoking use included cigarettes. He smoked an average of .5 packs per day. He quit smokeless tobacco use about 7 years ago.  His smokeless tobacco use included snuff. He reports current alcohol use. He reports that he does not use drugs.   Physical Exam: There were no vitals taken for this visit.  Constitutional:  Alert and oriented,  No acute distress. HEENT: [place] AT, moist mucus membranes.  Trachea midline, no masses. Cardiovascular: No clubbing, cyanosis, or edema. Respiratory: Normal respiratory effort, no increased work of breathing. GI: Abdomen is soft, nontender, nondistended, no abdominal masses GU: No CVA tenderness Lymph: No cervical or inguinal lymphadenopathy. Skin: No rashes, bruises or suspicious lesions. Neurologic: Grossly intact, no focal deficits, moving all 4 extremities. Psychiatric: Normal mood and affect.  Laboratory Data:  Lab Results  Component Value Date   CREATININE 1.04 02/02/2022     No results found for: "HGBA1C"  Urinalysis   Pertinent Imaging:   Assessment & Plan:     No follow-ups on file.  [facility] [address], [address] [place], [place] [address] [phone]  I,Kailey Littlejohn,acting as a scribe for [facility], PA-C.,have documented all relevant documentation on the behalf of SHANNON MCGOWAN, PA-C,as directed by  [facility], PA-C while in the presence of SHANNON MCGOWAN, PA-C.

## 2022-03-15 NOTE — Progress Notes (Deleted)
03/15/22 8:40 PM   Daniel Estrada Jul 24, 1961 [id number]  Referring provider:  Juluis Pitch, MD [phone] [place],  [place] [id number]  Urological history  1.  Elevated PSA -PSA Trend             4.51 in 2017             3.5 in 2017     Prostate Specific Ag, Serum  Latest Ref Rng 0.0 - 4.0 ng/mL  10/13/2017 1.0   10/23/2018 1.2   07/02/2019 1.1   06/30/2020 0.9   08/19/2021 3.0   11/15/2021 1.4   02/02/2022 1.2   02/17/2022 1.2    2. BPH with LU TS -PSA, 02/2022 -1.2 -I PSS *** -Managed with finasteride 5 mg daily     HPI: Daniel Estrada is a 61 y.o.male who presents today for follow-up with PSA.     Score:  1-7 Mild 8-19 Moderate 20-35 Severe      PMH: Past Medical History:  Diagnosis Date   BPH (benign prostatic hyperplasia)    Elevated PSA     Surgical History: Past Surgical History:  Procedure Laterality Date   Back surgeries  2000/2010   ruptured discs    Home Medications:  Allergies as of 03/16/2022   No Known Allergies      Medication List        Accurate as of March 15, 2022  8:40 PM. If you have any questions, ask your nurse or doctor.          finasteride 5 MG tablet Commonly known as: PROSCAR Take 1 tablet (5 mg total) by mouth daily.   FISH OIL PO Take by mouth.   MULTI-VITAMIN DAILY PO Take by mouth.   sildenafil 20 MG tablet Commonly known as: REVATIO        Allergies:  No Known Allergies  Family History: Family History  Problem Relation Age of Onset   Kidney disease Neg Hx    Prostate cancer Neg Hx    Bladder Cancer Neg Hx    Kidney cancer Neg Hx     Social History:  reports that he has quit smoking. His smoking use included cigarettes. He smoked an average of .5 packs per day. He quit smokeless tobacco use about 7 years ago.  His smokeless tobacco use included snuff. He reports current alcohol use. He reports that he does not use drugs.   Physical Exam: There were no vitals taken for this visit.   Constitutional:  Well nourished. Alert and oriented, No acute distress. HEENT: [place] AT, moist mucus membranes.  Trachea midline Cardiovascular: No clubbing, cyanosis, or edema. Respiratory: Normal respiratory effort, no increased work of breathing. GU: No CVA tenderness.  No bladder fullness or masses.  Patient with circumcised/uncircumcised phallus. ***Foreskin easily retracted***  Urethral meatus is patent.  No penile discharge. No penile lesions or rashes. Scrotum without lesions, cysts, rashes and/or edema.  Testicles are located scrotally bilaterally. No masses are appreciated in the testicles. Left and right epididymis are normal. Rectal: Patient with  normal sphincter tone. Anus and perineum without scarring or rashes. No rectal masses are appreciated. Prostate is approximately *** grams, *** nodules are appreciated. Seminal vesicles are normal. Neurologic: Grossly intact, no focal deficits, moving all 4 extremities. Psychiatric: Normal mood and affect.   Laboratory Data: Lab Results  Component Value Date   CREATININE 1.04 02/02/2022   Component     Latest Ref Rng 02/02/2022  Appearance  CLEAR    Color, UA yellow   Clarity, UA clear   Glucose     Negative  Negative   Bilirubin, UA negative   Ketones, UA negative   Specific Gravity, UA     1.010 - 1.025  1.020   RBC, UA negative   pH, UA     5.0 - 8.0  6.0   Protein,UA     Negative  Negative   Urobilinogen, UA     0.2 or 1.0 E.U./dL 0.2   Nitrite, UA negative   Leukocytes,UA     Negative  Negative     Component     Latest Ref Rng 02/02/2022  Prostate Specific Ag, Serum     0.0 - 4.0 ng/mL 1.2   WBC     3.4 - 10.8 x10E3/uL 6.8   RBC     4.14 - 5.80 x10E6/uL 5.33   Hemoglobin     13.0 - 17.7 g/dL 16.8   HCT     37.5 - 51.0 % 48.0   MCV     79 - 97 fL 90   MCH     26.6 - 33.0 pg 31.5   MCHC     31.5 - 35.7 g/dL 35.0   RDW     11.6 - 15.4 % 12.8   Platelets     150 - 450 x10E3/uL 206   Sodium     134 -  144 mmol/L 141   Potassium     3.5 - 5.2 mmol/L 4.4   Chloride     96 - 106 mmol/L 103   Glucose     70 - 99 mg/dL 93   BUN     6 - 24 mg/dL 15   Creatinine     0.76 - 1.27 mg/dL 1.04   Calcium     8.7 - 10.2 mg/dL 9.6   Total Protein     6.0 - 8.5 g/dL 6.7   Albumin     3.8 - 4.9 g/dL 4.5   AST     0 - 40 IU/L 24   ALT     0 - 44 IU/L 20   Alkaline Phosphatase     44 - 121 IU/L 69   Total Bilirubin     0.0 - 1.2 mg/dL 0.4   Neutrophils     Not Estab. % 72   NEUT#     1.4 - 7.0 x10E3/uL 4.9   Lymphocyte #     0.7 - 3.1 x10E3/uL 1.4   Basophils Absolute     0.0 - 0.2 x10E3/uL 0.1   Immature Granulocytes     Not Estab. % 1   Uric Acid     3.8 - 8.4 mg/dL 6.2   eGFR     >59 mL/min/1.73 83   BUN/Creatinine Ratio     9 - 20  14   Phosphorus     2.8 - 4.1 mg/dL 3.5   Globulin, Total     1.5 - 4.5 g/dL 2.2   Albumin/Globulin Ratio     1.2 - 2.2  2.0   LDH     121 - 224 IU/L 156   GGT     0 - 65 IU/L 14   Iron     38 - 169 ug/dL 102   Cholesterol, Total     100 - 199 mg/dL 178   Triglycerides     0 - 149 mg/dL 85   HDL Cholesterol     >  39 mg/dL 45   VLDL Cholesterol Cal     5 - 40 mg/dL 16   LDL Chol Calc (NIH)     0 - 99 mg/dL 117 (H)   Total CHOL/HDL Ratio     0.0 - 5.0 ratio 4.0   Estimated CHD Risk     0.0 - 1.0 times avg. 0.7   TSH     0.450 - 4.500 uIU/mL 1.390   Thyroxine (T4)     4.5 - 12.0 ug/dL 8.5   T3 Uptake Ratio     24 - 39 % 27   Free Thyroxine Index     1.2 - 4.9  2.3   Lymphs     Not Estab. % 20   Monocytes     Not Estab. % 4   Eos     Not Estab. % 2   Basos     Not Estab. % 1   Monocytes Absolute     0.1 - 0.9 x10E3/uL 0.3   EOS (ABSOLUTE)     0.0 - 0.4 x10E3/uL 0.1   Immature Grans (Abs)     0.0 - 0.1 x10E3/uL 0.1     Legend: (H) High I have reviewed the labs.    Pertinent Imaging: N/A  Assessment & Plan:    1. BPH with LUTS -PSA stable -DRE benign -UA benign -PVR < 300 cc -symptoms - *** -most  bothersome symptoms are *** -continue conservative management, avoiding bladder irritants and timed voiding's -Initiate alpha-blocker (***), discussed side effects *** -Initiate 5 alpha reductase inhibitor (***), discussed side effects *** -Continue tamsulosin 0.4 mg daily, alfuzosin 10 mg daily, Rapaflo 8 mg daily, terazosin, doxazosin, Cialis 5 mg daily and finasteride 5 mg daily, dutasteride 0.5 mg daily***:refills given -Cannot tolerate medication or medication failure, schedule cystoscopy ***      No follow-ups on file.  Zara Council, PA-C   [facility] [address], [place], [place] [address] [phone]

## 2022-03-16 ENCOUNTER — Ambulatory Visit: Payer: 59 | Admitting: Urology

## 2022-03-16 DIAGNOSIS — N400 Enlarged prostate without lower urinary tract symptoms: Secondary | ICD-10-CM

## 2022-04-04 NOTE — Progress Notes (Unsigned)
04/05/22 4:11 PM   Daniel Estrada 1960/11/06 [id number]  Referring provider:  Juluis Pitch, MD [phone] [place],  [place] [address]  Urological history  1.  Elevated PSA -PSA Trend             4.51 in 2017             3.5 in 2017   Prostate Specific Ag, Serum  Latest Ref Rng 0.0 - 4.0 ng/mL  10/13/2017 1.0   10/23/2018 1.2   07/02/2019 1.1   06/30/2020 0.9   08/19/2021 3.0   11/15/2021 1.4   02/02/2022 1.2   02/17/2022 1.2    2. BPH with LU TS -PSA, 02/2022 -1.2 -I PSS 7/2 -Managed with finasteride 5 mg daily     HPI: Daniel Estrada is a 61 y.o.male who presents today for follow-up with PSA.   He has no urinary complaints at this time.  He has not had any issues with the finasteride.  Patient denies any modifying or aggravating factors.  Patient denies any gross hematuria, dysuria or suprapubic/flank pain.  Patient denies any fevers, chills, nausea or vomiting.    He had been talking with his son lately though regarding some new onset fatigue and he is wondering if he has low testosterone.   IPSS     Row Name 04/05/22 1500         International Prostate Symptom Score   How often have you had the sensation of not emptying your bladder? Less than 1 in 5     How often have you had to urinate less than every two hours? Less than 1 in 5 times     How often have you found you stopped and started again several times when you urinated? Less than 1 in 5 times     How often have you found it difficult to postpone urination? Not at All     How often have you had a weak urinary stream? Less than half the time     How often have you had to strain to start urination? Not at All     How many times did you typically get up at night to urinate? 2 Times     Total IPSS Score 7       Quality of Life due to urinary symptoms   If you were to spend the rest of your life with your urinary condition just the way it is now how would you feel about that? Mostly Satisfied               Score:  1-7 Mild 8-19 Moderate 20-35 Severe      PMH: Past Medical History:  Diagnosis Date   BPH (benign prostatic hyperplasia)    Elevated PSA     Surgical History: Past Surgical History:  Procedure Laterality Date   Back surgeries  2000/2010   ruptured discs    Home Medications:  Allergies as of 04/05/2022   No Known Allergies      Medication List        Accurate as of April 05, 2022  4:11 PM. If you have any questions, ask your nurse or doctor.          STOP taking these medications    sildenafil 20 MG tablet Commonly known as: REVATIO Stopped by: Hank Walling, PA-C       TAKE these medications    finasteride 5 MG tablet Commonly known as: PROSCAR Take 1 tablet (  5 mg total) by mouth daily.   FISH OIL PO Take by mouth.   MULTI-VITAMIN DAILY PO Take by mouth.        Allergies:  No Known Allergies  Family History: Family History  Problem Relation Age of Onset   Kidney disease Neg Hx    Prostate cancer Neg Hx    Bladder Cancer Neg Hx    Kidney cancer Neg Hx     Social History:  reports that he has quit smoking. His smoking use included cigarettes. He smoked an average of .5 packs per day. He quit smokeless tobacco use about 7 years ago.  His smokeless tobacco use included snuff. He reports current alcohol use. He reports that he does not use drugs.   Physical Exam: BP 118/76   Pulse 76   Ht 5' 9"  (1.753 m)   Wt 200 lb (90.7 kg)   BMI 29.53 kg/m   Constitutional:  Well nourished. Alert and oriented, No acute distress. HEENT: [place] AT, moist mucus membranes.  Trachea midline Cardiovascular: No clubbing, cyanosis, or edema. Respiratory: Normal respiratory effort, no increased work of breathing. Neurologic: Grossly intact, no focal deficits, moving all 4 extremities. Psychiatric: Normal mood and affect.   Laboratory Data: Lab Results  Component Value Date   CREATININE 1.04 02/02/2022   Component     Latest Ref Rng  02/02/2022  Appearance     CLEAR    Color, UA yellow   Clarity, UA clear   Glucose     Negative  Negative   Bilirubin, UA negative   Ketones, UA negative   Specific Gravity, UA     1.010 - 1.025  1.020   RBC, UA negative   pH, UA     5.0 - 8.0  6.0   Protein,UA     Negative  Negative   Urobilinogen, UA     0.2 or 1.0 E.U./dL 0.2   Nitrite, UA negative   Leukocytes,UA     Negative  Negative     Component     Latest Ref Rng 02/02/2022  Prostate Specific Ag, Serum     0.0 - 4.0 ng/mL 1.2   WBC     3.4 - 10.8 x10E3/uL 6.8   RBC     4.14 - 5.80 x10E6/uL 5.33   Hemoglobin     13.0 - 17.7 g/dL 16.8   HCT     37.5 - 51.0 % 48.0   MCV     79 - 97 fL 90   MCH     26.6 - 33.0 pg 31.5   MCHC     31.5 - 35.7 g/dL 35.0   RDW     11.6 - 15.4 % 12.8   Platelets     150 - 450 x10E3/uL 206   Sodium     134 - 144 mmol/L 141   Potassium     3.5 - 5.2 mmol/L 4.4   Chloride     96 - 106 mmol/L 103   Glucose     70 - 99 mg/dL 93   BUN     6 - 24 mg/dL 15   Creatinine     0.76 - 1.27 mg/dL 1.04   Calcium     8.7 - 10.2 mg/dL 9.6   Total Protein     6.0 - 8.5 g/dL 6.7   Albumin     3.8 - 4.9 g/dL 4.5   AST     0 - 40 IU/L 24  ALT     0 - 44 IU/L 20   Alkaline Phosphatase     44 - 121 IU/L 69   Total Bilirubin     0.0 - 1.2 mg/dL 0.4   Neutrophils     Not Estab. % 72   NEUT#     1.4 - 7.0 x10E3/uL 4.9   Lymphocyte #     0.7 - 3.1 x10E3/uL 1.4   Basophils Absolute     0.0 - 0.2 x10E3/uL 0.1   Immature Granulocytes     Not Estab. % 1   Uric Acid     3.8 - 8.4 mg/dL 6.2   eGFR     >59 mL/min/1.73 83   BUN/Creatinine Ratio     9 - 20  14   Phosphorus     2.8 - 4.1 mg/dL 3.5   Globulin, Total     1.5 - 4.5 g/dL 2.2   Albumin/Globulin Ratio     1.2 - 2.2  2.0   LDH     121 - 224 IU/L 156   GGT     0 - 65 IU/L 14   Iron     38 - 169 ug/dL 102   Cholesterol, Total     100 - 199 mg/dL 178   Triglycerides     0 - 149 mg/dL 85   HDL Cholesterol      >39 mg/dL 45   VLDL Cholesterol Cal     5 - 40 mg/dL 16   LDL Chol Calc (NIH)     0 - 99 mg/dL 117 (H)   Total CHOL/HDL Ratio     0.0 - 5.0 ratio 4.0   Estimated CHD Risk     0.0 - 1.0 times avg. 0.7   TSH     0.450 - 4.500 uIU/mL 1.390   Thyroxine (T4)     4.5 - 12.0 ug/dL 8.5   T3 Uptake Ratio     24 - 39 % 27   Free Thyroxine Index     1.2 - 4.9  2.3   Lymphs     Not Estab. % 20   Monocytes     Not Estab. % 4   Eos     Not Estab. % 2   Basos     Not Estab. % 1   Monocytes Absolute     0.1 - 0.9 x10E3/uL 0.3   EOS (ABSOLUTE)     0.0 - 0.4 x10E3/uL 0.1   Immature Grans (Abs)     0.0 - 0.1 x10E3/uL 0.1     Legend: (H) High I have reviewed the labs.    Pertinent Imaging: N/A  Assessment & Plan:    1. BPH with LUTS -PSA stable -continue conservative management, avoiding bladder irritants and timed voiding's -finasteride 5 mg daily  2. Fatigue -He states he has noticed some more fatigue recently and in conversations with his son, his son recommended he had his testosterone checked -When he returns in 6 months we will have a morning appointment so we can recheck his PSA and a morning testosterone at that visit   Return in about 6 months (around 10/06/2022) for IPSS, SHIM, PSA, testosteron and exam.  Zara Council, PA-C   [facility] [address] [place], [place] [address] [phone]

## 2022-04-05 ENCOUNTER — Encounter: Payer: Self-pay | Admitting: Urology

## 2022-04-05 ENCOUNTER — Ambulatory Visit (INDEPENDENT_AMBULATORY_CARE_PROVIDER_SITE_OTHER): Payer: 59 | Admitting: Urology

## 2022-04-05 VITALS — BP 118/76 | HR 76 | Ht 69.0 in | Wt 200.0 lb

## 2022-04-05 DIAGNOSIS — N401 Enlarged prostate with lower urinary tract symptoms: Secondary | ICD-10-CM

## 2022-04-05 DIAGNOSIS — R5383 Other fatigue: Secondary | ICD-10-CM | POA: Diagnosis not present

## 2022-04-05 DIAGNOSIS — N400 Enlarged prostate without lower urinary tract symptoms: Secondary | ICD-10-CM

## 2022-05-16 DIAGNOSIS — R1031 Right lower quadrant pain: Secondary | ICD-10-CM | POA: Diagnosis not present

## 2022-05-17 DIAGNOSIS — R1031 Right lower quadrant pain: Secondary | ICD-10-CM | POA: Diagnosis not present

## 2022-09-17 ENCOUNTER — Other Ambulatory Visit: Payer: Self-pay | Admitting: Urology

## 2022-09-17 DIAGNOSIS — N400 Enlarged prostate without lower urinary tract symptoms: Secondary | ICD-10-CM

## 2022-10-03 ENCOUNTER — Other Ambulatory Visit: Payer: 59

## 2022-10-05 ENCOUNTER — Other Ambulatory Visit: Payer: 59

## 2022-10-06 ENCOUNTER — Ambulatory Visit: Payer: 59 | Admitting: Urology

## 2022-10-10 ENCOUNTER — Other Ambulatory Visit: Payer: 59

## 2022-10-13 ENCOUNTER — Other Ambulatory Visit: Payer: 59

## 2022-10-13 ENCOUNTER — Ambulatory Visit: Payer: 59 | Admitting: Urology

## 2022-10-13 DIAGNOSIS — N400 Enlarged prostate without lower urinary tract symptoms: Secondary | ICD-10-CM

## 2022-10-14 LAB — TESTOSTERONE: Testosterone: 412 ng/dL (ref 264–916)

## 2022-10-14 LAB — PSA: Prostate Specific Ag, Serum: 1.2 ng/mL (ref 0.0–4.0)

## 2022-10-19 NOTE — Progress Notes (Unsigned)
10/20/22 3:08 PM   Daniel Estrada 27-Jan-1961 [id number]  Referring provider:  Juluis Pitch, MD [phone] [place],  [place] [address]  Urological history  1.  Elevated PSA -PSA Trend             4.51 in 2017             3.5 in 2017   Prostate Specific Ag, Serum  Latest Ref Rng 0.0 - 4.0 ng/mL  10/23/2018 1.2   07/02/2019 1.1   06/30/2020 0.9   08/19/2021 3.0   11/15/2021 1.4   02/02/2022 1.2   02/17/2022 1.2   10/13/2022 1.2   Corrected value 2.4  2. BPH with LU TS -I PSS 7/2 -finasteride 5 mg daily     HPI: Daniel Estrada is a 62 y.o.male who presents today for follow-up with PSA.   Afternoon serum testosterone was 412 on October 13, 2022.    I PSS 7/2  He has no issues with urination.  Patient denies any modifying or aggravating factors.  Patient denies any gross hematuria, dysuria or suprapubic/flank pain.  Patient denies any fevers, chills, nausea or vomiting.     IPSS     Row Name 10/20/22 1400         International Prostate Symptom Score   How often have you had the sensation of not emptying your bladder? Less than 1 in 5     How often have you had to urinate less than every two hours? Less than half the time     How often have you found you stopped and started again several times when you urinated? Not at All     How often have you found it difficult to postpone urination? Less than 1 in 5 times     How often have you had a weak urinary stream? Less than 1 in 5 times     How often have you had to strain to start urination? Not at All     How many times did you typically get up at night to urinate? 2 Times     Total IPSS Score 7       Quality of Life due to urinary symptoms   If you were to spend the rest of your life with your urinary condition just the way it is now how would you feel about that? Mostly Satisfied               Score:  1-7 Mild 8-19 Moderate 20-35 Severe    SHIM 20    Patient still having spontaneous erections.  He denies  any pain or curvature with erections.     SHIM     Row Name 10/20/22 1455         SHIM: Over the last 6 months:   How do you rate your confidence that you could get and keep an erection? Moderate     When you had erections with sexual stimulation, how often were your erections hard enough for penetration (entering your partner)? Sometimes (about half the time)     During sexual intercourse, how often were you able to maintain your erection after you had penetrated (entered) your partner? Most Times (much more than half the time)     During sexual intercourse, how difficult was it to maintain your erection to completion of intercourse? Not Difficult     When you attempted sexual intercourse, how often was it satisfactory for you? Almost Always or Always  SHIM Total Score   SHIM 20              Score: 1-7 Severe ED 8-11 Moderate ED 12-16 Mild-Moderate ED 17-21 Mild ED 22-25 No ED   PMH: Past Medical History:  Diagnosis Date   BPH (benign prostatic hyperplasia)    Elevated PSA     Surgical History: Past Surgical History:  Procedure Laterality Date   Back surgeries  2000/2010   ruptured discs    Home Medications:  Allergies as of 10/20/2022   No Known Allergies      Medication List        Accurate as of October 20, 2022  3:08 PM. If you have any questions, ask your nurse or doctor.          finasteride 5 MG tablet Commonly known as: PROSCAR Take 1 tablet (5 mg total) by mouth daily.   FISH OIL PO Take by mouth.   MULTI-VITAMIN DAILY PO Take by mouth.        Allergies:  No Known Allergies  Family History: Family History  Problem Relation Age of Onset   Kidney disease Neg Hx    Prostate cancer Neg Hx    Bladder Cancer Neg Hx    Kidney cancer Neg Hx     Social History:  reports that he has quit smoking. His smoking use included cigarettes. He smoked an average of .5 packs per day. He quit smokeless tobacco use about 7 years ago.  His  smokeless tobacco use included snuff. He reports current alcohol use. He reports that he does not use drugs.   Physical Exam: BP 119/79   Pulse 76   Ht '5\' 9"'$  (1.753 m)   Wt 220 lb (99.8 kg)   BMI 32.49 kg/m   Constitutional:  Well nourished. Alert and oriented, No acute distress. HEENT: [place] AT, moist mucus membranes.  Trachea midline Cardiovascular: No clubbing, cyanosis, or edema. Respiratory: Normal respiratory effort, no increased work of breathing. GU: No CVA tenderness.  No bladder fullness or masses.  Patient with circumcised phallus.   Urethral meatus is patent.  No penile discharge. No penile lesions or rashes. Scrotum without lesions, cysts, rashes and/or edema.  Testicles are located scrotally bilaterally. No masses are appreciated in the testicles. Left and right epididymis are normal. Rectal: Patient with  normal sphincter tone. Anus and perineum without scarring or rashes. No rectal masses are appreciated. Prostate is approximately 50 grams, could only palpate the apex and the midportion of the gland, no nodules are appreciated. Seminal vesicles could not be palpated Neurologic: Grossly intact, no focal deficits, moving all 4 extremities. Psychiatric: Normal mood and affect.   Laboratory Data: Component     Latest Ref Rng 10/13/2022  Testosterone     264 - 916 ng/dL 412    Component     Latest Ref Rng 10/13/2022  Prostate Specific Ag, Serum     0.0 - 4.0 ng/mL 1.2    Serum creatinine (05/2022) 1.0   Urinalysis  Color Colorless, Straw, Light Yellow, Yellow, Dark Yellow Light Yellow  Clarity Clear Clear  Specific Gravity 1.005 - 1.030 1.023  pH, Urine 5.0 - 8.0 5.5  Protein, Urinalysis Negative mg/dL Negative  Glucose, Urinalysis Negative mg/dL Negative  Ketones, Urinalysis Negative mg/dL Negative  Blood, Urinalysis Negative Negative  Nitrite, Urinalysis Negative Negative  Leukocyte Esterase, Urinalysis Negative Negative  Bilirubin, Urinalysis Negative Negative   Urobilinogen, Urinalysis 0.2 - 1.0 mg/dL 0.2  WBC, UA <=5 /hpf 0  Red Blood Cells, Urinalysis <=3 /hpf 0  Bacteria, Urinalysis 0 - 5 /hpf 0-5  Squamous Epithelial Cells, Urinalysis /hpf 0  Resulting Agency  [facility]   Specimen Collected: 05/17/22 15:39   Performed by: [place]: 05/17/22 16:54  Received From: [facility]  Result Received: 10/03/22 16:33  I have reviewed the labs.    Pertinent Imaging: N/A  Assessment & Plan:    1. BPH with LUTS -PSA stable -continue conservative management, avoiding bladder irritants and timed voiding's -finasteride 5 mg daily     Return in about 1 year (around 10/20/2023) for IPSS, SHIM, PSA and exam.  Zara Council, PA-C   [facility] [address] [place], [place] [address] [phone]

## 2022-10-20 ENCOUNTER — Encounter: Payer: Self-pay | Admitting: Urology

## 2022-10-20 ENCOUNTER — Ambulatory Visit (INDEPENDENT_AMBULATORY_CARE_PROVIDER_SITE_OTHER): Payer: 59 | Admitting: Urology

## 2022-10-20 VITALS — BP 119/79 | HR 76 | Ht 69.0 in | Wt 220.0 lb

## 2022-10-20 DIAGNOSIS — N401 Enlarged prostate with lower urinary tract symptoms: Secondary | ICD-10-CM

## 2022-10-20 DIAGNOSIS — N138 Other obstructive and reflux uropathy: Secondary | ICD-10-CM

## 2022-10-20 DIAGNOSIS — N400 Enlarged prostate without lower urinary tract symptoms: Secondary | ICD-10-CM

## 2022-10-20 MED ORDER — FINASTERIDE 5 MG PO TABS: 5.0000 mg | ORAL_TABLET | Freq: Every day | ORAL | 3 refills | Status: DC

## 2023-09-29 ENCOUNTER — Ambulatory Visit: Payer: Self-pay

## 2023-09-29 DIAGNOSIS — Z0000 Encounter for general adult medical examination without abnormal findings: Secondary | ICD-10-CM

## 2023-09-29 LAB — POCT URINALYSIS DIPSTICK
Bilirubin, UA: NEGATIVE
Blood, UA: NEGATIVE
Glucose, UA: NEGATIVE
Ketones, UA: NEGATIVE
Leukocytes, UA: NEGATIVE
Nitrite, UA: NEGATIVE
Protein, UA: NEGATIVE
Spec Grav, UA: 1.01 (ref 1.010–1.025)
Urobilinogen, UA: 0.2 U/dL
pH, UA: 6 (ref 5.0–8.0)

## 2023-10-03 ENCOUNTER — Ambulatory Visit: Payer: Self-pay | Admitting: Physician Assistant

## 2023-10-03 ENCOUNTER — Encounter: Payer: Self-pay | Admitting: Physician Assistant

## 2023-10-03 VITALS — BP 131/78 | HR 78 | Temp 97.7°F | Resp 12 | Ht 69.0 in | Wt 224.0 lb

## 2023-10-03 DIAGNOSIS — Z0000 Encounter for general adult medical examination without abnormal findings: Secondary | ICD-10-CM

## 2023-10-03 LAB — CMP12+LP+TP+TSH+6AC+PSA+CBC…
ALT: 28 [IU]/L (ref 0–44)
AST: 26 [IU]/L (ref 0–40)
Albumin: 4.8 g/dL (ref 3.8–4.9)
Alkaline Phosphatase: 73 [IU]/L (ref 44–121)
BUN/Creatinine Ratio: 16 (ref 10–24)
BUN: 15 mg/dL (ref 8–27)
Basophils Absolute: 0.1 10*3/uL (ref 0.0–0.2)
Basos: 1 %
Bilirubin Total: 0.4 mg/dL (ref 0.0–1.2)
Calcium: 10 mg/dL (ref 8.6–10.2)
Chloride: 105 mmol/L (ref 96–106)
Chol/HDL Ratio: 4.8 {ratio} (ref 0.0–5.0)
Cholesterol, Total: 214 mg/dL — ABNORMAL HIGH (ref 100–199)
Creatinine, Ser: 0.96 mg/dL (ref 0.76–1.27)
EOS (ABSOLUTE): 0.2 10*3/uL (ref 0.0–0.4)
Eos: 3 %
Estimated CHD Risk: 1 times avg. (ref 0.0–1.0)
Free Thyroxine Index: 2.7 (ref 1.2–4.9)
GGT: 19 [IU]/L (ref 0–65)
Globulin, Total: 2.4 g/dL (ref 1.5–4.5)
Glucose: 96 mg/dL (ref 70–99)
HDL: 45 mg/dL (ref >39)
Hematocrit: 54.1 % — ABNORMAL HIGH (ref 37.5–51.0)
Hemoglobin: 18.4 g/dL — ABNORMAL HIGH (ref 13.0–17.7)
Immature Grans (Abs): 0 10*3/uL (ref 0.0–0.1)
Immature Granulocytes: 1 %
Iron: 92 ug/dL (ref 38–169)
LDH: 204 [IU]/L (ref 121–224)
LDL Chol Calc (NIH): 158 mg/dL — ABNORMAL HIGH (ref 0–99)
Lymphocytes Absolute: 1.3 10*3/uL (ref 0.7–3.1)
Lymphs: 20 %
MCH: 30.9 pg (ref 26.6–33.0)
MCHC: 34 g/dL (ref 31.5–35.7)
MCV: 91 fL (ref 79–97)
Monocytes Absolute: 0.4 10*3/uL (ref 0.1–0.9)
Monocytes: 6 %
Neutrophils Absolute: 4.6 10*3/uL (ref 1.4–7.0)
Neutrophils: 69 %
Phosphorus: 3.1 mg/dL (ref 2.8–4.1)
Platelets: 246 10*3/uL (ref 150–450)
Potassium: 5.4 mmol/L — ABNORMAL HIGH (ref 3.5–5.2)
Prostate Specific Ag, Serum: 1.2 ng/mL (ref 0.0–4.0)
RBC: 5.95 x10E6/uL — ABNORMAL HIGH (ref 4.14–5.80)
RDW: 12.5 % (ref 11.6–15.4)
Sodium: 143 mmol/L (ref 134–144)
T3 Uptake Ratio: 27 % (ref 24–39)
T4, Total: 10 ug/dL (ref 4.5–12.0)
TSH: 0.88 u[IU]/mL (ref 0.450–4.500)
Total Protein: 7.2 g/dL (ref 6.0–8.5)
Triglycerides: 63 mg/dL (ref 0–149)
Uric Acid: 6.1 mg/dL (ref 3.8–8.4)
VLDL Cholesterol Cal: 11 mg/dL (ref 5–40)
WBC: 6.6 10*3/uL (ref 3.4–10.8)
eGFR: 90 mL/min/{1.73_m2} (ref >59)

## 2023-10-03 NOTE — Progress Notes (Signed)
[facility] ____________________________________________   None    (approximate)  I have reviewed the triage vital signs and the nursing notes.   HISTORY  Chief Complaint Annual Exam   HPI Daniel DEGRACE is a 63 y.o. male          Past Medical History:  Diagnosis Date   BPH (benign prostatic hyperplasia)    Elevated PSA     Patient Active Problem List   Diagnosis Date Noted   Elevated PSA 10/07/2015   BPH with obstruction/lower urinary tract symptoms 10/07/2015    Past Surgical History:  Procedure Laterality Date   Back surgeries  2000/2010   ruptured discs    Prior to Admission medications   Medication Sig Start Date End Date Taking? Authorizing Provider  finasteride (PROSCAR) 5 MG tablet Take 1 tablet (5 mg total) by mouth daily. 10/20/22   Michiel Cowboy A, PA-C  Multiple Vitamin (MULTI-VITAMIN DAILY PO) Take by mouth.    [provider]  Omega-3 Fatty Acids (FISH OIL PO) Take by mouth.    [provider]    Allergies Patient has no known allergies.  Family History  Problem Relation Age of Onset   Kidney disease Neg Hx    Prostate cancer Neg Hx    Bladder Cancer Neg Hx    Kidney cancer Neg Hx     Social History Social History   Tobacco Use   Smoking status: Former    Current packs/day: 0.50    Types: Cigarettes   Smokeless tobacco: Former    Types: Snuff    Quit date: 11/2014   Tobacco comments:    quit 15 years heavy smokeless tobacco user  Vaping Use   Vaping status: Never Used  Substance Use Topics   Alcohol use: Yes    Alcohol/week: 0.0 standard drinks of alcohol    Comment: rare   Drug use: No    Review of Systems Constitutional: No fever/chills Eyes: No visual changes. ENT: No sore throat. Cardiovascular: Denies chest pain. Respiratory: Denies shortness of breath. Gastrointestinal: No abdominal pain.  No nausea, no vomiting.  No diarrhea.  No  constipation. Genitourinary: Negative for dysuria.  BPH Musculoskeletal: Negative for back pain. Skin: Negative for rash. Neurological: Negative for headaches, focal weakness or numbness.  ____________________________________________   PHYSICAL EXAM:  VITAL SIGNS: P 131/78  BP Location Left Arm  Patient Position Sitting  Cuff Size Large  Pulse Rate 78  Temp 97.7 F (36.5 C)  Temp Source Temporal  Weight 224 lb (101.6 kg)  Height 5\' 9"  (1.753 m)  Resp 12  SpO2 97 %   Other Vitals   BMI: 33.08 kg/m2  BSA: 2.22 m2   Constitutional: Alert and oriented. Well appearing and in no acute distress. Eyes: Conjunctivae are normal. PERRL. EOMI. Head: Atraumatic. Nose: No congestion/rhinnorhea. Mouth/Throat: Mucous membranes are moist.  Oropharynx non-erythematous. Neck: No stridor.  No cervical spine tenderness to palpation. Hematological/Lymphatic/Immunilogical: No cervical lymphadenopathy. Cardiovascular: Normal rate, regular rhythm. Grossly normal heart sounds.  Good peripheral circulation. Respiratory: Normal respiratory effort.  No retractions. Lungs CTAB. Gastrointestinal: Soft and nontender. No distention. No abdominal bruits. No CVA tenderness. Genitourinary: Deferred Musculoskeletal: No lower extremity tenderness nor edema.  No joint effusions. Neurologic:  Normal speech and language. No gross focal neurologic deficits are appreciated. No gait instability. Skin:  Skin is warm, dry and intact. No rash noted. Psychiatric: Mood and affect are normal. Speech and behavior are normal.  ____________________________________________  LABS        Component Ref Range & Units (hover) 4 d ago 1 yr ago 4 yr ago 15 yr ago  Color, UA Light Yellow yellow    Clarity, UA Clear clear    Glucose, UA Negative Negative    Bilirubin, UA Negative negative    Ketones, UA Negative negative    Spec Grav, UA 1.010 1.020    Blood, UA Negative negative    pH, UA 6.0 6.0    Protein, UA  Negative Negative    Urobilinogen, UA 0.2 0.2  0.2 R  Nitrite, UA Negative negative    Leukocytes, UA Negative Negative     NEGATIVE MICROSCOPIC NOT DONE ON URINES WITH NEGATIVE PROTEIN, BLOOD, LEUKOCYTES, NITRITE, OR GLUCOSE <1000 mg/dL. R    Appearance   CLEAR Abnormal  R CLEAR  Odor      Resulting Agency   [facility]        Specimen Collected: 09/29/23 08:58 Last Resulted: 09/29/23 08:58      Lab Flowsheet      Order Details      View Encounter      Lab and Collection Details      Routing      Result History    View All Conversations on this Encounter    R=Reference range differs from most recent result in table    Result Care Coordination   Patient Communication   Add Comments   Add Notifications  Back to Top   Other Results from 09/29/2023   Contains abnormal data CMP12+LP+TP+TSH+6AC+PSA+CBC. Order: [id number]  Status: Final result     Next appt: 10/20/2023 at 10:30 AM in Urology ([facility])     Dx: Routine adult health maintenance     Test Result Released: Yes (not seen)   0 Result Notes           Component Ref Range & Units (hover) 4 d ago (09/29/23) 11 mo ago (10/13/22) 1 yr ago (02/17/22) 1 yr ago (02/02/22) 1 yr ago (11/15/21) 2 yr ago (08/19/21) 3 yr ago (06/30/20)  Glucose 96   93     Uric Acid 6.1   6.2 CM     Comment:            Therapeutic target for gout patients: <6.0  BUN 15   15 R     Creatinine, Ser 0.96   1.04     eGFR 90   83     BUN/Creatinine Ratio 16   14 R     Sodium 143   141     Potassium 5.4 High    4.4     Chloride 105   103     Calcium 10.0   9.6 R     Phosphorus 3.1   3.5     Total Protein 7.2   6.7     Albumin 4.8   4.5     Globulin, Total 2.4   2.2     Bilirubin Total 0.4   0.4     Alkaline Phosphatase 73   69     LDH 204   156     AST 26   24     ALT 28   20     GGT 19   14     Iron 92   102     Cholesterol, Total 214 High    178     Triglycerides 63   85  HDL 45   45     VLDL Cholesterol Cal 11    16     LDL Chol Calc (NIH) 158 High    117 High      Chol/HDL Ratio 4.8   4.0 CM     Comment:                                   T. Chol/HDL Ratio                                             Men  Women                               1/2 Avg.Risk  3.4    3.3                                   Avg.Risk  5.0    4.4                                2X Avg.Risk  9.6    7.1                                3X Avg.Risk 23.4   11.0  Estimated CHD Risk 1.0   0.7 CM     Comment: The CHD Risk is based on the T. Chol/HDL ratio. Other factors affect CHD Risk such as hypertension, smoking, diabetes, severe obesity, and family history of premature CHD.  TSH 0.880   1.390     T4, Total 10.0   8.5     T3 Uptake Ratio 27   27     Free Thyroxine Index 2.7   2.3     Prostate Specific Ag, Serum 1.2 1.2 CM 1.2 CM 1.2 CM 1.4 CM 3.0 CM 0.9 CM  Comment: Roche ECLIA methodology. According to the American Urological Association, Serum PSA should decrease and remain at undetectable levels after radical prostatectomy. The AUA defines biochemical recurrence as an initial PSA value 0.2 ng/mL or greater followed by a subsequent confirmatory PSA value 0.2 ng/mL or greater. Values obtained with different assay methods or kits cannot be used interchangeably. Results cannot be interpreted as absolute evidence of the presence or absence of malignant disease.  WBC 6.6   6.8     RBC 5.95 High    5.33     Hemoglobin 18.4 High    16.8     Hematocrit 54.1 High    48.0     MCV 91   90     MCH 30.9   31.5     MCHC 34.0   35.0     RDW 12.5   12.8     Platelets 246   206     Neutrophils 69   72     Lymphs 20   20     Monocytes 6   4     Eos 3   2     Basos 1   1     Neutrophils Absolute 4.6  4.9     Lymphocytes Absolute 1.3   1.4     Monocytes Absolute 0.4   0.3     EOS (ABSOLUTE) 0.2   0.1     Basophils Absolute 0.1   0.1     Immature Granulocytes 1   1     Immature Grans (Abs) 0.0   0.1                  ____________________________________________  EKG  Sinus rhythm at 69 bpm ____________________________________________    ____________________________________________   INITIAL IMPRESSION / ASSESSMENT AND PLAN  As part of my medical decision making, I reviewed the following data within the electronic MEDICAL RECORD NUMBER       No acute findings on physical exam, EKG, labs.     ____________________________________________   FINAL CLINICAL IMPRESSION( Well exam   ED Discharge Orders     None        Note:  This document was prepared using Dragon voice recognition software and may include unintentional dictation errors.

## 2023-10-17 ENCOUNTER — Other Ambulatory Visit: Payer: Self-pay

## 2023-10-17 DIAGNOSIS — N138 Other obstructive and reflux uropathy: Secondary | ICD-10-CM

## 2023-10-20 ENCOUNTER — Other Ambulatory Visit: Payer: 59

## 2023-10-20 DIAGNOSIS — N138 Other obstructive and reflux uropathy: Secondary | ICD-10-CM

## 2023-10-21 LAB — PSA: Prostate Specific Ag, Serum: 1.6 ng/mL (ref 0.0–4.0)

## 2023-10-23 NOTE — Progress Notes (Unsigned)
 10/24/23 3:13 PM   Daniel Estrada 07-19-1961 [id number]  Referring provider:  Dorothey Baseman, MD [phone] [place],  [place] [address]  Urological history  1.  Elevated PSA -On finasteride -PSA 4.51 in 2017 -PSA (10/2023) 1.6            2. BPH with LU TS -finasteride 5 mg daily   HPI: Daniel Estrada is a 63 y.o.male who presents today for one year follow up.  Previous records reviewed.     I PSS 10/2  He has no urinary complaints today.  He states he is at his baseline anyways not bothered.  Patient denies any modifying or aggravating factors.  Patient denies any recent UTI's, gross hematuria, dysuria or suprapubic/flank pain.  Patient denies any fevers, chills, nausea or vomiting.     IPSS     Row Name 10/24/23 1400         International Prostate Symptom Score   How often have you had the sensation of not emptying your bladder? Less than 1 in 5     How often have you had to urinate less than every two hours? About half the time     How often have you found you stopped and started again several times when you urinated? Less than half the time     How often have you found it difficult to postpone urination? Less than 1 in 5 times     How often have you had a weak urinary stream? Less than 1 in 5 times     How often have you had to strain to start urination? Not at All     How many times did you typically get up at night to urinate? 2 Times     Total IPSS Score 10       Quality of Life due to urinary symptoms   If you were to spend the rest of your life with your urinary condition just the way it is now how would you feel about that? Mostly Satisfied              Score:  1-7 Mild 8-19 Moderate 20-35 Severe   SHIM 22   Patient still having spontaneous erections.  He denies any pain or curvature with erections.     SHIM     Row Name 10/24/23 1442         SHIM: Over the last 6 months:   How do you rate your confidence that you could get and keep  an erection? Moderate     When you had erections with sexual stimulation, how often were your erections hard enough for penetration (entering your partner)? Most Times (much more than half the time)     During sexual intercourse, how often were you able to maintain your erection after you had penetrated (entered) your partner? Almost Always or Always     During sexual intercourse, how difficult was it to maintain your erection to completion of intercourse? Not Difficult     When you attempted sexual intercourse, how often was it satisfactory for you? Almost Always or Always       SHIM Total Score   SHIM 22               Score: 1-7 Severe ED 8-11 Moderate ED 12-16 Mild-Moderate ED 17-21 Mild ED 22-25 No ED   PMH: Past Medical History:  Diagnosis Date   BPH (benign prostatic hyperplasia)    Elevated PSA  Surgical History: Past Surgical History:  Procedure Laterality Date   Back surgeries  2000/2010   ruptured discs    Home Medications:  Allergies as of 10/24/2023   No Known Allergies      Medication List        Accurate as of October 24, 2023  3:13 PM. If you have any questions, ask your nurse or doctor.          finasteride 5 MG tablet Commonly known as: PROSCAR Take 1 tablet (5 mg total) by mouth daily.   FISH OIL PO Take by mouth.   MULTI-VITAMIN DAILY PO Take by mouth.        Allergies:  No Known Allergies  Family History: Family History  Problem Relation Age of Onset   Kidney disease Neg Hx    Prostate cancer Neg Hx    Bladder Cancer Neg Hx    Kidney cancer Neg Hx     Social History:  reports that he has quit smoking. His smoking use included cigarettes. He quit smokeless tobacco use about 8 years ago.  His smokeless tobacco use included snuff. He reports current alcohol use. He reports that he does not use drugs.   Physical Exam: BP 123/82   Pulse 83   Ht 5\' 9"  (1.753 m)   Wt 215 lb (97.5 kg)   BMI 31.75 kg/m    Constitutional:  Well nourished. Alert and oriented, No acute distress. HEENT: [place] AT, moist mucus membranes.  Trachea midline Cardiovascular: No clubbing, cyanosis, or edema. Respiratory: Normal respiratory effort, no increased work of breathing. GU: No CVA tenderness.  No bladder fullness or masses.  Patient with circumcised phallus.   Urethral meatus is patent.  No penile discharge. No penile lesions or rashes. Scrotum without lesions, cysts, rashes and/or edema.  Testicles are located scrotally bilaterally. No masses are appreciated in the testicles. Left and right epididymis are normal. Rectal: Patient with  normal sphincter tone. Anus and perineum without scarring or rashes. No rectal masses are appreciated. Prostate is approximately 50 + grams, could not palpate the entire gland, no nodules are appreciated. Seminal vesicles could not be palpated.  Neurologic: Grossly intact, no focal deficits, moving all 4 extremities. Psychiatric: Normal mood and affect.   Laboratory Data: Results for orders placed or performed in visit on 10/20/23  PSA   Collection Time: 10/20/23 10:12 AM  Result Value Ref Range   Prostate Specific Ag, Serum 1.6 0.0 - 4.0 ng/mL    CMP12+LP+TP+TSH+6AC+PSA+CBC. Order: [id number]  Status: Final result     Next appt: 10/24/2023 at 03:00 PM in Urology [facility], PA-C)     Dx: Routine adult health maintenance   Test Result Released: Yes (not seen)   0 Result Notes  important suggestion  Newer results are available. Click to view them now.            Component Ref Range & Units (hover) 3 wk ago (09/29/23) 1 yr ago (10/13/22) 1 yr ago (02/17/22) 1 yr ago (02/02/22) 1 yr ago (11/15/21) 2 yr ago (08/19/21) 3 yr ago (06/30/20)  Glucose 96   93     Uric Acid 6.1   6.2 CM     Comment:            Therapeutic target for gout patients: <6.0  BUN 15   15 R     Creatinine, Ser 0.96   1.04     eGFR 90   83     BUN/Creatinine Ratio  16   14 R     Sodium 143   141      Potassium 5.4 High    4.4     Chloride 105   103     Calcium 10.0   9.6 R     Phosphorus 3.1   3.5     Total Protein 7.2   6.7     Albumin 4.8   4.5     Globulin, Total 2.4   2.2     Bilirubin Total 0.4   0.4     Alkaline Phosphatase 73   69     LDH 204   156     AST 26   24     ALT 28   20     GGT 19   14     Iron 92   102     Cholesterol, Total 214 High    178     Triglycerides 63   85     HDL 45   45     VLDL Cholesterol Cal 11   16     LDL Chol Calc (NIH) 158 High    117 High      Chol/HDL Ratio 4.8   4.0 CM     Comment:                                   T. Chol/HDL Ratio                                             Men  Women                               1/2 Avg.Risk  3.4    3.3                                   Avg.Risk  5.0    4.4                                2X Avg.Risk  9.6    7.1                                3X Avg.Risk 23.4   11.0  Estimated CHD Risk 1.0   0.7 CM     Comment: The CHD Risk is based on the T. Chol/HDL ratio. Other factors affect CHD Risk such as hypertension, smoking, diabetes, severe obesity, and family history of premature CHD.  TSH 0.880   1.390     T4, Total 10.0   8.5     T3 Uptake Ratio 27   27     Free Thyroxine Index 2.7   2.3     Prostate Specific Ag, Serum 1.2 1.2 CM 1.2 CM 1.2 CM 1.4 CM 3.0 CM 0.9 CM  Comment: Roche ECLIA methodology. According to the American Urological Association, Serum PSA should decrease and remain at undetectable levels after radical prostatectomy. The AUA defines biochemical recurrence as an initial PSA value 0.2 ng/mL or greater followed by  a subsequent confirmatory PSA value 0.2 ng/mL or greater. Values obtained with different assay methods or kits cannot be used interchangeably. Results cannot be interpreted as absolute evidence of the presence or absence of malignant disease.  WBC 6.6   6.8     RBC 5.95 High    5.33     Hemoglobin 18.4 High    16.8     Hematocrit 54.1 High    48.0     MCV 91   90      MCH 30.9   31.5     MCHC 34.0   35.0     RDW 12.5   12.8     Platelets 246   206     Neutrophils 69   72     Lymphs 20   20     Monocytes 6   4     Eos 3   2     Basos 1   1     Neutrophils Absolute 4.6   4.9     Lymphocytes Absolute 1.3   1.4     Monocytes Absolute 0.4   0.3     EOS (ABSOLUTE) 0.2   0.1     Basophils Absolute 0.1   0.1     Immature Granulocytes 1   1     Immature Grans (Abs) 0.0   0.1     Resulting Agency [facility]         Narrative Performed byVerdell Carmine Performed at:  [address] - [facility] [address] [place], [place]  [id number] Lab Director: Jolene Schimke MD, Phone:  [phone]  Specimen Collected: 09/29/23 08:35 Last Resulted: 10/03/23 08:11     POCT urinalysis dipstick Order: [id number]  Status: Final result     Next appt: 10/24/2023 at 03:00 PM in Urology [facility], PA-C)     Dx: Routine adult health maintenance   Test Result Released: Yes (not seen)   0 Result Notes       Component Ref Range & Units (hover) 3 wk ago 1 yr ago 4 yr ago 15 yr ago  Color, UA Light Yellow yellow    Clarity, UA Clear clear    Glucose, UA Negative Negative    Bilirubin, UA Negative negative    Ketones, UA Negative negative    Spec Grav, UA 1.010 1.020    Blood, UA Negative negative    pH, UA 6.0 6.0    Protein, UA Negative Negative    Urobilinogen, UA 0.2 0.2  0.2 R  Nitrite, UA Negative negative    Leukocytes, UA Negative Negative     NEGATIVE MICROSCOPIC NOT DONE ON URINES WITH NEGATIVE PROTEIN, BLOOD, LEUKOCYTES, NITRITE, OR GLUCOSE <1000 mg/dL. R    Appearance   CLEAR Abnormal  R CLEAR  Odor      Resulting Agency   [facility]        Specimen Collected: 09/29/23 08:58 Last Resulted: 09/29/23 08:58   I have reviewed the labs.    Pertinent Imaging: N/A  Assessment & Plan:    1. BPH with LUTS -PSA stable -continue conservative management, avoiding bladder irritants and  timed voiding's -finasteride 5 mg daily     Return in about 1 year (around 10/23/2024) for PSA, I PSS, SHIM .  Cloretta Ned   [facility] [address] [place], [place] [address] [phone]

## 2023-10-24 ENCOUNTER — Encounter: Payer: Self-pay | Admitting: Urology

## 2023-10-24 ENCOUNTER — Ambulatory Visit (INDEPENDENT_AMBULATORY_CARE_PROVIDER_SITE_OTHER): Payer: 59 | Admitting: Urology

## 2023-10-24 VITALS — BP 123/82 | HR 83 | Ht 69.0 in | Wt 215.0 lb

## 2023-10-24 DIAGNOSIS — N401 Enlarged prostate with lower urinary tract symptoms: Secondary | ICD-10-CM | POA: Diagnosis not present

## 2023-10-24 DIAGNOSIS — N400 Enlarged prostate without lower urinary tract symptoms: Secondary | ICD-10-CM | POA: Diagnosis not present

## 2023-10-24 DIAGNOSIS — N138 Other obstructive and reflux uropathy: Secondary | ICD-10-CM | POA: Diagnosis not present

## 2023-10-24 MED ORDER — FINASTERIDE 5 MG PO TABS: 5.0000 mg | ORAL_TABLET | Freq: Every day | ORAL | 3 refills | Status: AC

## 2023-12-05 ENCOUNTER — Ambulatory Visit (INDEPENDENT_AMBULATORY_CARE_PROVIDER_SITE_OTHER): Admitting: Dermatology

## 2023-12-05 ENCOUNTER — Encounter: Payer: Self-pay | Admitting: Dermatology

## 2023-12-05 DIAGNOSIS — L821 Other seborrheic keratosis: Secondary | ICD-10-CM

## 2023-12-05 NOTE — Progress Notes (Signed)
   New Patient Visit   Subjective  Daniel Estrada is a 63 y.o. male who presents for the following: spot at right scalp. Patient advises it has been there for many years but patient's wife wanted him to get it checked again. We have seen patient at least 5 years ago.  No hx skin cancer.   The patient has spots, moles and lesions to be evaluated, some may be new or changing and the patient may have concern these could be cancer.   The following portions of the chart were reviewed this encounter and updated as appropriate: medications, allergies, medical history  Review of Systems:  No other skin or systemic complaints except as noted in HPI or Assessment and Plan.  Objective  Well appearing patient in no apparent distress; mood and affect are within normal limits.   A focused examination was performed of the following areas: scalp  Relevant exam findings are noted in the Assessment and Plan.    Assessment & Plan     SEBORRHEIC KERATOSIS - Stuck-on, waxy, tan-brown papules and/or plaques  - Benign-appearing - Discussed benign etiology and prognosis. - Observe - Call for any changes     SEBORRHEIC KERATOSES    Return if symptoms worsen or fail to improve.  Kerstin Peeling, RMA, am acting as scribe for Harris Liming, MD .   Documentation: I have reviewed the above documentation for accuracy and completeness, and I agree with the above.  Harris Liming, MD

## 2023-12-05 NOTE — Patient Instructions (Signed)

## 2024-09-19 ENCOUNTER — Ambulatory Visit: Payer: Self-pay

## 2024-09-19 DIAGNOSIS — R5383 Other fatigue: Secondary | ICD-10-CM

## 2024-09-19 DIAGNOSIS — Z0000 Encounter for general adult medical examination without abnormal findings: Secondary | ICD-10-CM

## 2024-09-19 LAB — POCT URINALYSIS DIPSTICK
Bilirubin, UA: NEGATIVE
Blood, UA: NEGATIVE
Glucose, UA: NEGATIVE
Ketones, UA: NEGATIVE
Leukocytes, UA: NEGATIVE
Nitrite, UA: NEGATIVE
Protein, UA: NEGATIVE
Spec Grav, UA: 1.015
Urobilinogen, UA: 0.2 U/dL
pH, UA: 6.5

## 2024-09-20 LAB — TESTOSTERONE: Testosterone: 532 ng/dL (ref 264–916)

## 2024-09-25 ENCOUNTER — Encounter: Payer: Self-pay | Admitting: Physician Assistant

## 2024-10-16 ENCOUNTER — Other Ambulatory Visit

## 2024-10-23 ENCOUNTER — Ambulatory Visit: Admitting: Urology
